# Patient Record
Sex: Female | Born: 1995 | Race: White | Hispanic: No | Marital: Single | Smoking: Never smoker
Health system: Southern US, Community
[De-identification: ages and names within clinical notes are randomized; demographics above are authoritative.]

## PROBLEM LIST (undated history)

## (undated) DIAGNOSIS — G43909 Migraine, unspecified, not intractable, without status migrainosus: Secondary | ICD-10-CM

## (undated) HISTORY — DX: Migraine, unspecified, not intractable, without status migrainosus: G43.909

---

## 2019-02-04 ENCOUNTER — Encounter (HOSPITAL_COMMUNITY): Payer: Self-pay

## 2019-02-04 ENCOUNTER — Other Ambulatory Visit: Payer: Self-pay

## 2019-02-04 DIAGNOSIS — R1084 Generalized abdominal pain: Secondary | ICD-10-CM | POA: Insufficient documentation

## 2019-02-04 DIAGNOSIS — R195 Other fecal abnormalities: Secondary | ICD-10-CM | POA: Diagnosis present

## 2019-02-04 DIAGNOSIS — K625 Hemorrhage of anus and rectum: Secondary | ICD-10-CM | POA: Diagnosis not present

## 2019-02-04 NOTE — ED Triage Notes (Signed)
Pt presents to ED with complaints of abdominal pain, bloody and mucous stools since 1830. Pt c/o generalized abd pain which hurts when she has to go to bathroom. Pt states bleeding was bright red.

## 2019-02-05 ENCOUNTER — Emergency Department (HOSPITAL_COMMUNITY): Payer: BC Managed Care – PPO

## 2019-02-05 ENCOUNTER — Emergency Department (HOSPITAL_COMMUNITY)
Admission: EM | Admit: 2019-02-05 | Discharge: 2019-02-05 | Disposition: A | Discharge status: Home / Self Care | Payer: BC Managed Care – PPO | Attending: Emergency Medicine | Admitting: Emergency Medicine

## 2019-02-05 DIAGNOSIS — K625 Hemorrhage of anus and rectum: Secondary | ICD-10-CM

## 2019-02-05 LAB — TYPE AND SCREEN
ABO/RH(D): B POS
Antibody Screen: NEGATIVE

## 2019-02-05 LAB — CBC
HCT: 42.6 % (ref 36.0–46.0)
Hemoglobin: 13.8 g/dL (ref 12.0–15.0)
MCH: 29.2 pg (ref 26.0–34.0)
MCHC: 32.4 g/dL (ref 30.0–36.0)
MCV: 90.1 fL (ref 80.0–100.0)
Platelets: 231 10*3/uL (ref 150–400)
RBC: 4.73 MIL/uL (ref 3.87–5.11)
RDW: 12.7 % (ref 11.5–15.5)
WBC: 6.1 10*3/uL (ref 4.0–10.5)
nRBC: 0 % (ref 0.0–0.2)

## 2019-02-05 LAB — COMPREHENSIVE METABOLIC PANEL
ALT: 12 U/L (ref 0–44)
AST: 16 U/L (ref 15–41)
Albumin: 3.9 g/dL (ref 3.5–5.0)
Alkaline Phosphatase: 54 U/L (ref 38–126)
Anion gap: 9 (ref 5–15)
BUN: 10 mg/dL (ref 6–20)
CO2: 24 mmol/L (ref 22–32)
Calcium: 8.8 mg/dL — ABNORMAL LOW (ref 8.9–10.3)
Chloride: 104 mmol/L (ref 98–111)
Creatinine, Ser: 0.6 mg/dL (ref 0.44–1.00)
GFR calc Af Amer: >60 mL/min (ref >60)
GFR calc non Af Amer: >60 mL/min (ref >60)
Glucose, Bld: 104 mg/dL — ABNORMAL HIGH (ref 70–99)
Potassium: 4.2 mmol/L (ref 3.5–5.1)
Sodium: 137 mmol/L (ref 135–145)
Total Bilirubin: 0.5 mg/dL (ref 0.3–1.2)
Total Protein: 7.5 g/dL (ref 6.5–8.1)

## 2019-02-05 LAB — URINALYSIS, ROUTINE W REFLEX MICROSCOPIC
Bacteria, UA: NONE SEEN
Bilirubin Urine: NEGATIVE
Glucose, UA: NEGATIVE mg/dL
Ketones, ur: NEGATIVE mg/dL
Leukocytes,Ua: NEGATIVE
Nitrite: NEGATIVE
Protein, ur: NEGATIVE mg/dL
Specific Gravity, Urine: 1.014 (ref 1.005–1.030)
pH: 6 (ref 5.0–8.0)

## 2019-02-05 LAB — POC OCCULT BLOOD, ED: Fecal Occult Bld: NEGATIVE

## 2019-02-05 LAB — POC URINE PREG, ED: Preg Test, Ur: NEGATIVE

## 2019-02-05 MED ORDER — IOHEXOL 300 MG/ML  SOLN
100.0000 mL | Freq: Once | INTRAMUSCULAR | Status: AC | PRN
  Administered 2019-02-05: 100 mL via INTRAVENOUS

## 2019-02-05 NOTE — ED Notes (Signed)
Pt ambulatory to waiting room. Pt verbalized understanding of discharge instructions.   

## 2019-02-05 NOTE — Discharge Instructions (Addendum)
Your testing is reassuring.  There is no evidence of any kind of colon mass or inflammation.  You may have an internal hemorrhoid that was bleeding.  You should follow-up with your primary doctor as well as the GI doctor.  Return to the ED with worsening pain, bleeding, dizziness, lightheadedness or any other concerns.

## 2019-02-05 NOTE — ED Provider Notes (Signed)
[facility] EMERGENCY DEPARTMENT Provider Note   CSN: [id number] Arrival date & time: 02/04/19  2221     History   Chief Complaint Chief Complaint  Patient presents with   Blood In Stools    HPI Mallory Singleton is a 23 y.o. female.     Patient here with abdominal cramping and rectal bleeding.  States that around 6:30 PM she had sudden onset lower abdominal cramping and went to go to the bathroom.  She had a loose bowel movement that was brown but small in caliber.  She had continued cramping and then went again with some more loose brown stools.  The third time she had a some orange and red discoloration in the toilet bowl mixed in with her stool.  She was concerned that it could be blood.  There was no blood with wiping.  The toilet bowl did not turn red.  She denies any fever, chills, nausea or vomiting.  No history of ulcerative colitis or Crohn's disease.  She is had bleeding in the past with straining and anal fissures but denies any straining at this point.  No anal trauma or anal sex.  Her abdominal cramping is since resolved.  No dizziness or lightheadedness.  No chest pain or shortness of breath.  No pain with urination or blood in the urine.  No family history of inflammatory bowel disease.  The history is provided by the patient.    History reviewed. No pertinent past medical history.  There are no active problems to display for this patient.   History reviewed. No pertinent surgical history.   OB History   No obstetric history on file.      Home Medications    Prior to Admission medications   Not on File    Family History No family history on file.  Social History Social History   Tobacco Use   Smoking status: Never Smoker   Smokeless tobacco: Never Used  Substance Use Topics   Alcohol use: Yes   Drug use: Never     Allergies   Patient has no allergy information on record.   Review of Systems Review of Systems  Constitutional: Negative  for activity change, appetite change, fatigue and fever.  Eyes: Negative for visual disturbance.  Respiratory: Negative for cough, chest tightness and shortness of breath.   Gastrointestinal: Positive for abdominal pain and rectal pain.  Genitourinary: Negative for dysuria.  Musculoskeletal: Negative for arthralgias, back pain and myalgias.  Neurological: Negative for dizziness, weakness and headaches.   all other systems are negative except as noted in the HPI and PMH.     Physical Exam Updated Vital Signs BP (!) 145/95 (BP Location: Right Arm)    Pulse (!) 102    Temp 98.5 F (36.9 C) (Oral)    Resp 16    Ht 5\' 6"  (1.676 m)    Wt 64.9 kg    LMP 01/14/2019    SpO2 99%    BMI 23.08 kg/m   Physical Exam Vitals signs and nursing note reviewed.  Constitutional:      General: She is not in acute distress.    Appearance: She is well-developed.  HENT:     Head: Normocephalic and atraumatic.     Mouth/Throat:     Pharynx: No oropharyngeal exudate.  Eyes:     Conjunctiva/sclera: Conjunctivae normal.     Pupils: Pupils are equal, round, and reactive to light.  Neck:     Musculoskeletal: Normal range of  motion and neck supple.     Comments: No meningismus. Cardiovascular:     Rate and Rhythm: Normal rate and regular rhythm.     Heart sounds: Normal heart sounds. No murmur.  Pulmonary:     Effort: Pulmonary effort is normal. No respiratory distress.     Breath sounds: Normal breath sounds.  Abdominal:     Palpations: Abdomen is soft.     Tenderness: There is no abdominal tenderness. There is no guarding or rebound.  Genitourinary:    Comments: Chaperone present.  No external hemorrhoids.  No fissures.  No gross blood on rectal exam.  No palpable masses. Musculoskeletal: Normal range of motion.        General: No tenderness.  Skin:    General: Skin is warm.  Neurological:     Mental Status: She is alert and oriented to person, place, and time.     Cranial Nerves: No cranial nerve  deficit.     Motor: No abnormal muscle tone.     Coordination: Coordination normal.     Comments: No ataxia on finger to nose bilaterally. No pronator drift. 5/5 strength throughout. CN 2-12 intact.Equal grip strength. Sensation intact.   Psychiatric:        Behavior: Behavior normal.      ED Treatments / Results  Labs (all labs ordered are listed, but only abnormal results are displayed) Labs Reviewed  COMPREHENSIVE METABOLIC PANEL - Abnormal; Notable for the following components:      Result Value   Glucose, Bld 104 (*)    Calcium 8.8 (*)    All other components within normal limits  URINALYSIS, ROUTINE W REFLEX MICROSCOPIC - Abnormal; Notable for the following components:   Hgb urine dipstick SMALL (*)    All other components within normal limits  CBC  POC OCCULT BLOOD, ED  POC URINE PREG, ED  POC OCCULT BLOOD, ED  TYPE AND SCREEN    EKG None  Radiology Ct Abdomen Pelvis W Contrast  Result Date: 02/05/2019 CLINICAL DATA:  GI bleeding with generalized abdominal pain. Mucus in stool. EXAM: CT ABDOMEN AND PELVIS WITH CONTRAST TECHNIQUE: Multidetector CT imaging of the abdomen and pelvis was performed using the standard protocol following bolus administration of intravenous contrast. CONTRAST:  14mL OMNIPAQUE IOHEXOL 300 MG/ML  SOLN COMPARISON:  None. FINDINGS: Lower chest:  No contributory findings. Hepatobiliary: No focal liver abnormality.No evidence of biliary obstruction or stone. Pancreas: Unremarkable. Spleen: Unremarkable. Adrenals/Urinary Tract: Negative adrenals. No hydronephrosis or stone. Unremarkable bladder. Stomach/Bowel: No obstruction. No evidence of bowel inflammation including appendicitis. Vascular/Lymphatic: No acute vascular abnormality. No mass or adenopathy. Reproductive:No pathologic findings. Other: Trace pelvic fluid which is low-density. Musculoskeletal: No acute abnormalities. IMPRESSION: 1. No acute finding.  No evidence of colitis. 2. Trace pelvic  fluid which is likely physiologic. Electronically Signed   By: Monte Fantasia M.D.   On: 02/05/2019 05:09    Procedures Procedures (including critical care time)  Medications Ordered in ED Medications - No data to display   Initial Impression / Assessment and Plan / ED Course  I have reviewed the triage vital signs and the nursing notes.  Pertinent labs & imaging results that were available during my care of the patient were reviewed by me and considered in my medical decision making (see chart for details).       Abdominal cramping with rectal bleeding.  Now resolved.  Abdomen is soft without peritoneal signs.  Vitals are stable.  Labs are reassuring with normal  hemoglobin.  Patient declines pain and nausea medications.  Risks and benefits of CT scan discussed with patient who wishes to proceed.  CT scan is negative for acute pathology.  Discussed with patient concern for possible internal hemorrhoid.  No evidence of mass or colitis.  She is stable for outpatient follow-up.  Advised increase fiber, increase fluid, follow-up with PCP and GI.  Return precautions discussed.  BP 132/88 (BP Location: Right Arm)    Pulse 85    Temp 98.5 F (36.9 C) (Oral)    Resp 15    Ht 5\' 6"  (1.676 m)    Wt 64.9 kg    LMP 01/14/2019    SpO2 100%    BMI 23.08 kg/m    Final Clinical Impressions(s) / ED Diagnoses   Final diagnoses:  Rectal bleeding    ED Discharge Orders    None       Glynn Octave, MD 02/05/19 [phone]

## 2019-02-10 ENCOUNTER — Encounter: Payer: Self-pay | Admitting: Gastroenterology

## 2019-03-10 ENCOUNTER — Ambulatory Visit: Payer: BC Managed Care – PPO | Admitting: Gastroenterology

## 2019-05-28 ENCOUNTER — Other Ambulatory Visit: Payer: Self-pay

## 2019-05-28 ENCOUNTER — Ambulatory Visit: Payer: BC Managed Care – PPO | Attending: Obstetrics & Gynecology | Admitting: Physical Therapy

## 2019-05-28 ENCOUNTER — Encounter: Payer: Self-pay | Admitting: Physical Therapy

## 2019-05-28 DIAGNOSIS — R278 Other lack of coordination: Secondary | ICD-10-CM

## 2019-05-28 DIAGNOSIS — R252 Cramp and spasm: Secondary | ICD-10-CM | POA: Diagnosis present

## 2019-05-28 DIAGNOSIS — M6281 Muscle weakness (generalized): Secondary | ICD-10-CM | POA: Insufficient documentation

## 2019-05-28 NOTE — Therapy (Signed)
[facility] [address], [address] [place], [place], [address] Phone: [phone]   Fax:  [phone]  Physical Therapy Evaluation  Patient Details  Name: Mallory Singleton MRN: [id number] Date of Birth: [birth year] Referring Provider (PT): Dr. Geraldine Solar. [place] Charon   Encounter Date: 05/28/2019  PT End of Session - 05/28/19 0953    Visit Number  1    Date for PT Re-Evaluation  08/20/19    Authorization Type  [employer]    PT Start Time  0920    PT Stop Time  1000    PT Time Calculation (min)  40 min    Activity Tolerance  Patient tolerated treatment well;No increased pain    Behavior During Therapy  WFL for tasks assessed/performed       Past Medical History:  Diagnosis Date  . Migraines     History reviewed. No pertinent surgical history.  There were no vitals filed for this visit.   Subjective Assessment - 05/28/19 0921    Subjective  Patient reports 3-4 years ago and started to have tears in the vulva area. My vagina is very red and irritated. As the doctor exams the patient she will clench the pelvic floor muscles. Patient does not have pain with intercourse only pain with tearing. 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    Patient Stated Goals  reduce pain and reduce muscle spasms    Currently in Pain?  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 Geraldine Solar. Wong      Precautions   Precautions  None      Restrictions   Weight Bearing Restrictions  No       Balance Screen   Has the patient fallen in the past 6 months  No    Has the patient had a decrease in activity level because of a fear of falling?   No    Is the patient reluctant to leave their home because of a fear of falling?   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, 10 hours M-F      Cognition   Overall Cognitive Status  Within Functional Limits for tasks assessed      Posture/Postural Control   Posture/Postural Control  No significant limitations      ROM / Strength   AROM / PROM / Strength  AROM;PROM;Strength      AROM   Overall AROM   Unable to assess    Lumbar - Right Side Bend  decreased by 25%    Lumbar -  Right Rotation  decreased by 25%      Strength   Right Hip ABduction  4+/5    Left Hip ABduction  4+/5      Palpation   SI assessment   ASIS are equal with left ASIS shallow; sacrum rotated right                Objective measurements completed on examination: See above findings.    Pelvic Floor Special Questions - 05/28/19 0001    Prior Pregnancies  No    Currently Sexually Active  Yes    Is this Painful  Yes    Marinoff Scale  discomfort that does not affect completion    Urinary Leakage  No    Fecal incontinence  No    External Perineal Exam  Q-tip test with tenderness located from 7 to 5 O'clock and right 7 O'clock    Skin Integrity  Intact;Erthema    Scar  Tear;Restricted    Perineal Body/Introitus   Normal    Prolapse  None    Pelvic Floor Internal Exam  Patient confirms identication and approves PT to assess pelvic floor and treatment    Exam Type  Vaginal    Palpation  tenderness located on the perineal body, bil. levator ani    Strength  weak squeeze, no lift               PT Education - 05/28/19 1004    Education Details  instruction  on perineal massage around the perineal body; instruction on vaginal moisturizers, lubricants, and vaginal care    Person(s) Educated  Patient    Methods  Explanation;Demonstration    Comprehension  Verbalized understanding       PT Short Term Goals - 05/28/19 1019      PT SHORT TERM GOAL #1   Title  independent with intial HEP    Time  4    Status  New    Target Date  06/25/19      PT SHORT TERM GOAL #2   Title  understand how to take care of the vaginal area to promote good vaginal  and vaginal tissue health    Time  4    Period  Weeks    Status  New    Target Date  06/25/19      PT SHORT TERM GOAL #3   Title  able to tolerate the Q-tip to touch the vulva area with no more than 1/10 pain due to improved tissue health    Time  4    Period  Weeks    Status  New    Target Date  06/25/19        PT Long Term Goals - 05/28/19 1122      PT LONG TERM GOAL #1   Title  independent with HEP and understand how to progress herself    Time  12    Period  Weeks    Status  New    Target Date  08/20/19      PT LONG TERM GOAL #2   Title  pain at end of work day in the perineal area decreased >/= 75% due to ability to relax her pelvic floor muscles    Time  12    Period  Weeks    Status  New    Target Date  08/20/19      PT LONG TERM GOAL #3   Title  able to have penetrative intercourse  with pain decreased </= 1/10 and not tearing due to improve tissue mobility of the perineal fascial restrictions and scar    Time  12    Period  Weeks    Status  New    Target Date  08/20/19      PT LONG TERM GOAL #4   Title  pelvic floor strength >/= 3/5 due to improve contractablity of the vaginal sphincter and the ability to fully lengthen the sphincter muscle    Time  12    Period  Weeks    Status  New    Target Date  08/20/19             Plan - 05/28/19 1006    Clinical Impression Statement  Patient is a 23 year old female with vulva pain and tearing at the perineal body  during intercourse for the past 4 years. Patient reports her pain level is 4/10. Patient will have discomfort after walking and standing at work in the vaginal area where she has the tear. Q-tip test with pain on 9-4 O'clock and and left 7 'clock. Palpable tenderness located on the bilateral levator ani, perineal body. Tightnes in the perineal body with scar restrictions from the tearing. Pelvic floor strength is 2/5 with holding 2 seconds. Redness along the vulva area. Bilateral hip abduction is 4+/5. Right lumbar sidebending and rotation decreased by 25%. Pelvis is rotated to the left and sacrum rotated right. Patient will benefit from skilled therapy to reduce tearing of the vulva area with improving the elongation of the tissue, and improve her strength.    Personal Factors and Comorbidities  Sex    Examination-Activity Limitations  Stand;Locomotion Level    Stability/Clinical Decision Making  Stable/Uncomplicated    Clinical Decision Making  Low    Rehab Potential  Excellent    PT Frequency  1x / week    PT Duration  12 weeks    PT Treatment/Interventions  ADLs/Self Care Home Management;Biofeedback;Cryotherapy;Electrical Stimulation;Moist Heat;Ultrasound;Neuromuscular re-education;Therapeutic exercise;Therapeutic activities;Patient/family education;Manual techniques;Dry needling;Scar mobilization    PT Next Visit Plan  scar massage, soft tissue work to the levator ani, pelvic floor stretches, bulging of the pelvic floor, see how MD appt went    Consulted and Agree with Plan of Care  Patient       Patient will benefit from skilled therapeutic intervention in order to improve the following deficits and impairments:  Decreased range of motion, Decreased coordination, Increased fascial restricitons, Increased muscle spasms, Decreased activity tolerance, Pain, Decreased scar mobility, Decreased strength  Visit Diagnosis: Muscle weakness (generalized) - Plan: PT plan of care cert/re-cert  Cramp and  spasm - Plan: PT plan of care cert/re-cert  Other lack of coordination - Plan: PT plan of care cert/re-cert     Problem List There are no problems to display for this patient.   Eulis FosterCheryl Cynethia Schindler, PT 05/28/19 11:27 AM    [facility] [address], [address] [place], [place], [address] Phone: [phone]   Fax:  [phone]  Name: Mallory Singleton MRN: [id number] Date of Birth: 08/28/1995

## 2019-05-28 NOTE — Patient Instructions (Addendum)
Lubrication . Used for intercourse to reduce friction . Avoid ones that have glycerin, warming gels, tingling gels, icing or cooling gel, scented . Avoid parabens due to a preservative similar to female sex hormone . May need to be reapplied once or several times during sexual activity . Can be applied to both partners genitals prior to vaginal penetration to minimize friction or irritation . Prevent irritation and mucosal tears that cause post coital pain and increased the risk of vaginal and urinary tract infections . Oil-based lubricants cannot be used with condoms due to breaking them down.  Least likely to irritate vaginal tissue.  . Plant based-lubes are safe . Silicone-based lubrication are thicker and last long and used for post-menopausal women  Vaginal Lubricators Here is a list of some suggested lubricators you can use for intercourse. Use the most hypoallergenic product.  You can place on you or your partner.   Slippery Stuff  Sylk or Sliquid Natural H2O ( good  if frequent UTI's)  Blossom Organics (www.blossom-organics.com)  Coconut oil, olive oil   PJur Woman Nude- water based lubricant, amazon  Uberlube- Amazon  Aloe Vera- Sprouts has an organic one  Yes lubricant- Ship broker, Target, [facility]  Olive and Bee intimate cream-  www.oliveandbee.com.au  Pink - [employer]  Hybird OASIS Things to avoid in lubricants are glycerin, warming gels, tingling gels, icing or cooling  gels, and scented gels.  Also avoid Vaseline. KY jelly, Replens, and Astroglide kills good bacteria(lactobacilli)  Things to avoid in the vaginal area . Do not use things to irritate the vulvar area . No lotions- see below . No soaps; can use Aveeno or Calendula cleanser if needed. Must be gentle . No deodorants . No douches . Good to sleep without underwear to let the vaginal area to air out . No scrubbing: spread the lips to let warm water rinse over labias  and pat dry  Creams that can be used on the Vulva Area  V [employer]  Vital V Wild Yam Salve  Julva- [employer] Botanical Pro-Meno Wild Yam Cream  Coconut oil, olive oil  Desert Havest Releveum or Desert YUM! Brands . They are used in the vagina to hydrate the mucous membrane that make up the vaginal canal. . Designed to keep a more normal acid balance (ph) . Once placed in the vagina, it will last between two to three days.  . Use 2-3 times per week at bedtime  . Ingredients to avoid is glycerin and fragrance, can increase chance of infection . Should not be used just before sex due to causing irritation . Most are gels administered either in a tampon-shaped applicator or as a vaginal suppository. They are non-hormonal.   Types of Moisturizers  . Vitamin E vaginal suppositories- Whole foods, Amazon . Moist Again . Coconut oil- can break down condoms . Julva- (Do no use if on Tamoxifen) amazon . Yes moisturizer- amazon . NeuEve Silk , NeuEve Silver for menopausal or over 65 (if have severe vaginal atrophy or cancer treatments use NeuEve Silk for  1 month than move to [employer])- [employer], ShapeConsultant.com.cy . Olive and Bee intimate cream- www.oliveandbee.com.au . Mae vaginal moisturizer- Amazon . Aloe   Creams to use externally on the Vulva area  Marathon Oil (good for for cancer patients that had radiation to the area)- [place] or [employer].https://garcia-valdez.org/  V-magic cream - amazon  Julva-amazon  Vital "V Wild Yam salve (  help moisturize and help with thinning vulvar area, does have [place] by Irwin Brakeman labial moisturizer ([place],   Coconut or olive oil  aloe   Things to avoid in the vaginal area . Do not use things to irritate the vulvar area . No lotions just specialized creams for the vulva area- Neogyn, V-magic, No soaps; can use Aveeno or Calendula  cleanser if needed. Must be gentle . No deodorants . No douches . Good to sleep without underwear to let the vaginal area to air out . No scrubbing: spread the lips to let warm water rinse over labias and pat dry [facility] [address] [place], [place] [address] Phone # [phone] Fax [phone]

## 2019-06-18 ENCOUNTER — Encounter: Payer: BC Managed Care – PPO | Admitting: Physical Therapy

## 2019-06-19 ENCOUNTER — Other Ambulatory Visit: Payer: Self-pay

## 2019-06-19 ENCOUNTER — Encounter: Payer: Self-pay | Admitting: Physical Therapy

## 2019-06-19 ENCOUNTER — Ambulatory Visit: Payer: BC Managed Care – PPO | Attending: Obstetrics & Gynecology | Admitting: Physical Therapy

## 2019-06-19 DIAGNOSIS — R252 Cramp and spasm: Secondary | ICD-10-CM | POA: Insufficient documentation

## 2019-06-19 DIAGNOSIS — M6281 Muscle weakness (generalized): Secondary | ICD-10-CM

## 2019-06-19 DIAGNOSIS — R278 Other lack of coordination: Secondary | ICD-10-CM | POA: Insufficient documentation

## 2019-06-19 NOTE — Patient Instructions (Signed)
Access Code: [email or web address]  URL: [email or web address]  Date: 06/19/2019  Prepared by: Eulis Foster   Exercises Supine Butterfly Groin Stretch - 1 reps - 1 sets - 1-2 min hold - 1x daily - 7x weekly Supine Hamstring Stretch - 2 reps - 1 sets - 30 sec hold - 1x daily - 7x weekly Supine Piriformis Stretch - 2 reps - 1 sets - 30 sec hold - 1x daily - 7x weekly V Sit Hip Adductor Hamstring Stretch - 1 reps - 1 sets - 30 sec hold - 1x daily - 7x weekly Child's Pose Stretch - 2 reps - 1 sets - 30 sec hold - 1x daily - 7x weekly [facility] [address] [place], [place] [address] Phone # [phone] Fax [phone]

## 2019-06-19 NOTE — Therapy (Signed)
[facility] [address], [address] [place], [place], [address] Phone: [phone]   Fax:  [phone]  Physical Therapy Treatment  Patient Details  Name: Mallory Singleton MRN: [id number] Date of Birth: 1995-10-26 Referring Provider (PT): Dr. Geraldine Solar. [place] Charon   Encounter Date: 06/19/2019  PT End of Session - 06/19/19 0803    Visit Number  2    Date for PT Re-Evaluation  08/20/19    Authorization Type  BCBS    PT Start Time  0800    PT Stop Time  0840    PT Time Calculation (min)  40 min    Activity Tolerance  Patient tolerated treatment well;No increased pain    Behavior During Therapy  WFL for tasks assessed/performed       Past Medical History:  Diagnosis Date  . Migraines     History reviewed. No pertinent surgical history.  There were no vitals filed for this visit.  Subjective Assessment - 06/19/19 0805    Subjective  I am going to see a dermatologist 07/17/2019. I have not had a flare-up. I have had relief with the cream. I had sex last week and I tore. I am waiting for a hormone cream.    Patient Stated Goals  reduce pain and reduce muscle spasms    Currently in Pain?  Yes    Pain Score  5     Pain Location  Vagina    Pain Orientation  Lower    Pain Descriptors / Indicators  Sharp    Pain Type  Acute pain    Pain Onset  More than a month ago    Pain Frequency  Intermittent    Aggravating Factors   during intercourse    Pain Relieving Factors  no intercourse for 2-3 weeks for tear to heal    Multiple Pain Sites  No                    Pelvic Floor Special Questions - 06/19/19 0001    Pelvic Floor Internal Exam  Patient confirms identication and approves PT to assess pelvic floor and treatment    Exam Type  Vaginal        [facility] Adult PT Treatment/Exercise - 06/19/19 0001      Self-Care   Self-Care  Other Self-Care Comments    Other Self-Care Comments   education on using [employer]  on the area of tear to help with healing      Lumbar Exercises: Stretches   Active Hamstring Stretch  Right;Left;1 rep;30 seconds    Active Hamstring Stretch Limitations  supine    Quadruped Mid Back Stretch  1 rep;30 seconds    Piriformis Stretch  Right;Left;1 rep;30 seconds    Piriformis Stretch Limitations  supine    Other Lumbar Stretch Exercise  butterfly stretch with diaphragmatic breathing    Other Lumbar Stretch Exercise  hip adductor stretch      Manual Therapy   Manual Therapy  Internal Pelvic Floor;Soft tissue mobilization    Manual therapy comments  educated patient on how to perform self perineal massage using a mirror and return demonstration    Soft tissue mobilization  to bilateral levator ani externally with elongation of the hip adductors    Internal Pelvic Floor  bilateral levator ani and by the pubic rami             PT Education - 06/19/19 0840    Education Details  Access Code: [email or web address]; education on self perineal massage and using [employer] to help tear heal    Person(s) Educated  Patient    Methods  Explanation;Demonstration;Verbal cues;Handout    Comprehension  Verbalized understanding;Returned demonstration       PT Short Term Goals - 06/19/19 0848      PT SHORT TERM GOAL #1   Title  independent with intial HEP    Time  4    Period  Weeks    Status  Achieved        PT Long Term Goals - 05/28/19 1122      PT LONG TERM GOAL #1   Title  independent with HEP and understand how to progress herself    Time  12    Period  Weeks    Status  New    Target Date  08/20/19      PT LONG TERM GOAL #2   Title  pain at end of work day in the perineal area decreased >/= 75% due to ability to relax her pelvic floor muscles    Time  12    Period  Weeks    Status  New    Target Date  08/20/19      PT LONG TERM GOAL #3   Title  able to have penetrative intercourse with pain decreased </= 1/10 and not tearing due to improve tissue mobility of  the perineal fascial restrictions and scar    Time  12    Period  Weeks    Status  New    Target Date  08/20/19      PT LONG TERM GOAL #4   Title  pelvic floor strength >/= 3/5 due to improve contractablity of the vaginal sphincter and the ability to fully lengthen the sphincter muscle    Time  12    Period  Weeks    Status  New    Target Date  08/20/19            Plan - 06/19/19 0803    Clinical Impression Statement  Patient is using V-magic and it is helping the area to feel more pliable. Patient had intercourse and tore at the posterior forchette. Patient is performing scar massage to the perineal body. Patient will be seeing a dermatologist to assist in the care of the vulvar area. Patient is to pick up a hormone compond to use on the vulva area. Patient had tenderness located on bilateral vulvar area. Patient will benefit from skilled therapy to reduce tearing of the vulva area withimproving the elongation of the tissue and improve her strength.    Personal Factors and Comorbidities  Sex    Examination-Activity Limitations  Stand;Locomotion Level    Stability/Clinical Decision Making  Stable/Uncomplicated    Rehab Potential  Excellent    PT Frequency  1x / week    PT Duration  12 weeks    PT Treatment/Interventions  ADLs/Self Care Home Management;Biofeedback;Cryotherapy;Electrical Stimulation;Moist Heat;Ultrasound;Neuromuscular re-education;Therapeutic exercise;Therapeutic activities;Patient/family education;Manual techniques;Dry needling;Scar mobilization    PT Next Visit Plan  scar massage, soft tissue work to the levator ani,  bulging of the pelvic floor    PT Home Exercise Plan  Access Code: [email or web address]    Consulted and Agree with Plan of Care  Patient       Patient will benefit from skilled therapeutic intervention in order to improve the following deficits and impairments:  Decreased range of motion, Decreased coordination, Increased fascial restricitons, Increased muscle  spasms, Decreased activity tolerance, Pain, Decreased scar mobility, Decreased strength  Visit Diagnosis: Muscle weakness (generalized)  Cramp and spasm  Other lack of coordination     Problem List There are no problems to display for this patient.   Eulis Foster, PT 06/19/19 8:49 AM   [facility] [address], [address] [place], [place], [address] Phone: [phone]   Fax:  [phone]  Name: KERIE BADGER MRN: [id number] Date of Birth: [birth year]

## 2019-06-26 ENCOUNTER — Ambulatory Visit: Payer: BC Managed Care – PPO | Admitting: Physical Therapy

## 2019-06-26 ENCOUNTER — Other Ambulatory Visit: Payer: Self-pay

## 2019-06-26 ENCOUNTER — Encounter: Payer: Self-pay | Admitting: Physical Therapy

## 2019-06-26 DIAGNOSIS — R278 Other lack of coordination: Secondary | ICD-10-CM

## 2019-06-26 DIAGNOSIS — M6281 Muscle weakness (generalized): Secondary | ICD-10-CM

## 2019-06-26 DIAGNOSIS — R252 Cramp and spasm: Secondary | ICD-10-CM

## 2019-06-26 NOTE — Therapy (Signed)
[facility] [address] [place], [place], [address] Phone: [phone]   Fax:  [phone]  Physical Therapy Treatment  Patient Details  Name: Mallory Singleton MRN: [id number] Date of Birth: 10-19-1995 Referring Provider (PT): Dr. Lovena Le. Jacelyn Grip   Encounter Date: 06/26/2019  PT End of Session - 06/26/19 0843    Visit Number  3    Date for PT Re-Evaluation  08/20/19    Authorization Type  BCBS    PT Start Time  0800    PT Stop Time  0843    PT Time Calculation (min)  43 min    Activity Tolerance  Patient tolerated treatment well;No increased pain    Behavior During Therapy  WFL for tasks assessed/performed       Past Medical History:  Diagnosis Date  . Migraines     History reviewed. No pertinent surgical history.  There were no vitals filed for this visit.  Subjective Assessment - 06/26/19 0803    Subjective  The left side is more tense and I am feeling the muscles relax. I got the hormone cream and I still have to use the other creams. I have had a different discharge and monitoring it. The scar still looks red and irritated.    Patient Stated Goals  reduce pain and reduce muscle spasms    Currently in Pain?  Yes    Pain Score  5     Pain Location  Vagina    Pain Orientation  Lower    Pain Descriptors / Indicators  Sharp    Pain Type  Acute pain    Pain Onset  More than a month ago    Pain Frequency  Intermittent    Aggravating Factors   during intercourse    Pain Relieving Factors  no intercourse for 2-3 weeks for tear to heal    Multiple Pain Sites  No                    Pelvic Floor Special Questions - 06/26/19 0001    Pelvic Floor Internal Exam  Patient confirms identication and approves PT to assess pelvic floor and treatment    Exam Type  Vaginal    Palpation  able to bulge the pelvic floor    Strength  weak squeeze, no lift        [facility] Adult PT Treatment/Exercise -  06/26/19 0001      Self-Care   Self-Care  Other Self-Care Comments    Other Self-Care Comments   education on vulva and labia massage to promote blood flow       Neuro Re-ed    Neuro Re-ed Details   bulging of the pelvic floor in reclined sitting to elongate the tissue      Lumbar Exercises: Stretches   Active Hamstring Stretch  Right;Left;1 rep;30 seconds    Active Hamstring Stretch Limitations  supine, using the strap    Piriformis Stretch  Right;Left;1 rep;30 seconds    Piriformis Stretch Limitations  supine    Other Lumbar Stretch Exercise  butterfly stretch in sitting    Other Lumbar Stretch Exercise  hip adductor stretch      Manual Therapy   Manual Therapy  Internal Pelvic Floor    Soft tissue mobilization  release of the levator ani with one finger in the vaginal canal and the other outside             PT Education -  06/26/19 0842    Education Details  bulging of the pelvic floor, vulvar and labia massage to improve blood flow and elongation of tissue; gave patient a handout on vaginal renewal    Person(s) Educated  Patient    Methods  Explanation;Demonstration    Comprehension  Verbalized understanding;Returned demonstration       PT Short Term Goals - 06/26/19 0847      PT SHORT TERM GOAL #1   Title  independent with intial HEP    Time  4    Period  Weeks    Status  Achieved    Target Date  06/25/19      PT SHORT TERM GOAL #2   Title  understand how to take care of the vaginal area to promote good vaginal  and vaginal tissue health    Time  4    Period  Weeks    Status  Achieved      PT SHORT TERM GOAL #3   Title  able to tolerate the Q-tip to touch the vulva area with no more than 1/10 pain due to improved tissue health    Time  4    Period  Weeks    Status  On-going        PT Long Term Goals - 05/28/19 1122      PT LONG TERM GOAL #1   Title  independent with HEP and understand how to progress herself    Time  12    Period  Weeks    Status   New    Target Date  08/20/19      PT LONG TERM GOAL #2   Title  pain at end of work day in the perineal area decreased >/= 75% due to ability to relax her pelvic floor muscles    Time  12    Period  Weeks    Status  New    Target Date  08/20/19      PT LONG TERM GOAL #3   Title  able to have penetrative intercourse with pain decreased </= 1/10 and not tearing due to improve tissue mobility of the perineal fascial restrictions and scar    Time  12    Period  Weeks    Status  New    Target Date  08/20/19      PT LONG TERM GOAL #4   Title  pelvic floor strength >/= 3/5 due to improve contractablity of the vaginal sphincter and the ability to fully lengthen the sphincter muscle    Time  12    Period  Weeks    Status  New    Target Date  08/20/19            Plan - 06/26/19 0844    Clinical Impression Statement  Patient was able to tolerate internal work with less pain. Patient levator ani muscle was able to relax with greater ease. Patient tissue is lighter pink now. Patient is able to bulge the pelvic floor correctly. Patient has not had intercourse therefore the tear can continue to heal. Patient understands how to massage the vulva and labia to improve blood flow and health of tissue. Patient will benefit from skilled therapy to reduce tearing of the vulva area with improving the elongation of the tissue and improve her strenght.    Personal Factors and Comorbidities  Sex    Examination-Activity Limitations  Stand;Locomotion Level    Stability/Clinical Decision Making  Stable/Uncomplicated  Rehab Potential  Excellent    PT Frequency  1x / week    PT Duration  12 weeks    PT Treatment/Interventions  ADLs/Self Care Home Management;Biofeedback;Cryotherapy;Electrical Stimulation;Moist Heat;Ultrasound;Neuromuscular re-education;Therapeutic exercise;Therapeutic activities;Patient/family education;Manual techniques;Dry needling;Scar mobilization    PT Next Visit Plan  scar massage,  soft tissue work to the levator ani especiall on the right,Q tip test    PT Home Exercise Plan  Access Code: [email or web address]    Consulted and Agree with Plan of Care  Patient       Patient will benefit from skilled therapeutic intervention in order to improve the following deficits and impairments:  Decreased range of motion, Decreased coordination, Increased fascial restricitons, Increased muscle spasms, Decreased activity tolerance, Pain, Decreased scar mobility, Decreased strength  Visit Diagnosis: Muscle weakness (generalized)  Cramp and spasm  Other lack of coordination     Problem List There are no problems to display for this patient.   Eulis Foster, PT 06/26/19 8:57 AM   [facility] [address] [place], [place], [address] Phone: [phone]   Fax:  [phone]  Name: DOYCE STONEHOUSE MRN: [id number] Date of Birth: 1995-09-18

## 2019-07-10 ENCOUNTER — Encounter: Payer: BC Managed Care – PPO | Admitting: Physical Therapy

## 2019-07-17 ENCOUNTER — Ambulatory Visit: Payer: BC Managed Care – PPO | Attending: Obstetrics & Gynecology | Admitting: Physical Therapy

## 2019-07-17 ENCOUNTER — Other Ambulatory Visit: Payer: Self-pay

## 2019-07-17 ENCOUNTER — Encounter: Payer: BC Managed Care – PPO | Admitting: Physical Therapy

## 2019-07-17 ENCOUNTER — Encounter: Payer: Self-pay | Admitting: Physical Therapy

## 2019-07-17 DIAGNOSIS — R252 Cramp and spasm: Secondary | ICD-10-CM | POA: Insufficient documentation

## 2019-07-17 DIAGNOSIS — R278 Other lack of coordination: Secondary | ICD-10-CM | POA: Insufficient documentation

## 2019-07-17 DIAGNOSIS — M6281 Muscle weakness (generalized): Secondary | ICD-10-CM

## 2019-07-17 NOTE — Patient Instructions (Addendum)
[email or web address]  [email or web address]   [facility] Vaginal Dilators (Individual) [facility] [address] [place], [place] [address] Phone # [phone] Fax [phone]

## 2019-07-17 NOTE — Therapy (Signed)
[facility] [address] [place], [place], [address] Phone: [phone]   Fax:  [phone]  Physical Therapy Treatment  Patient Details  Name: Mallory Singleton MRN: [id number] Date of Birth: 02-23-1996 Referring Provider (PT): Dr. Lovena Le. Jacelyn Grip   Encounter Date: 07/17/2019  PT End of Session - 07/17/19 1530    Visit Number  4    Date for PT Re-Evaluation  08/20/19    Authorization Type  BCBS    PT Start Time  7169    PT Stop Time  1530    PT Time Calculation (min)  45 min    Activity Tolerance  Patient tolerated treatment well;No increased pain    Behavior During Therapy  WFL for tasks assessed/performed       Past Medical History:  Diagnosis Date  . Migraines     History reviewed. No pertinent surgical history.  There were no vitals filed for this visit.  Subjective Assessment - 07/17/19 1448    Subjective  I went to the dermatologist today and feel PT will help best. When I massage I can feel the tension but afterwards I feel better. I started the estrogen cream. I will use the v-magic as needed.    Patient Stated Goals  reduce pain and reduce muscle spasms    Currently in Pain?  Yes    Pain Score  5     Pain Location  Vagina    Pain Orientation  Lower    Pain Descriptors / Indicators  Sharp    Pain Type  Acute pain    Pain Onset  More than a month ago    Pain Frequency  Intermittent    Aggravating Factors   during intercourse    Pain Relieving Factors  no intercourse for 2-3 weeks for tear to heal    Multiple Pain Sites  No                    Pelvic Floor Special Questions - 07/17/19 0001    Pelvic Floor Internal Exam  Patient confirms identication and approves PT to assess pelvic floor and treatment    Exam Type  Vaginal    Palpation  release of the left levator ani and right was tighter    Strength  weak squeeze, no lift        [facility] Adult PT Treatment/Exercise - 07/17/19  0001      Self-Care   Self-Care  Other Self-Care Comments    Other Self-Care Comments   education on different dilators to use to expand the vaginal canal and improve tissue elasticity for penile penetration      Therapeutic Activites    Therapeutic Activities  Other Therapeutic Activities    Other Therapeutic Activities  education on different positions with intercourse to reduce the tearing of the perineal body      Manual Therapy   Manual Therapy  Internal Pelvic Floor    Internal Pelvic Floor  bilateral levator ani with on hand internally and other externally releasing through all of the fascia             PT Education - 07/17/19 1510    Education Details  instruction on different dilators and information on different sexual positions to reduce tearing of the perineum    Person(s) Educated  Patient    Methods  Explanation;Demonstration;Handout    Comprehension  Returned demonstration;Verbalized understanding       PT Short Term  Goals - 06/26/19 0847      PT SHORT TERM GOAL #1   Title  independent with intial HEP    Time  4    Period  Weeks    Status  Achieved    Target Date  06/25/19      PT SHORT TERM GOAL #2   Title  understand how to take care of the vaginal area to promote good vaginal  and vaginal tissue health    Time  4    Period  Weeks    Status  Achieved      PT SHORT TERM GOAL #3   Title  able to tolerate the Q-tip to touch the vulva area with no more than 1/10 pain due to improved tissue health    Time  4    Period  Weeks    Status  On-going        PT Long Term Goals - 07/17/19 1533      PT LONG TERM GOAL #1   Title  independent with HEP and understand how to progress herself    Time  12    Period  Weeks    Status  On-going      PT LONG TERM GOAL #2   Title  pain at end of work day in the perineal area decreased >/= 75% due to ability to relax her pelvic floor muscles    Time  12    Period  Weeks    Status  On-going      PT LONG TERM  GOAL #3   Title  able to have penetrative intercourse with pain decreased </= 1/10 and not tearing due to improve tissue mobility of the perineal fascial restrictions and scar    Time  12    Period  Weeks    Status  On-going      PT LONG TERM GOAL #4   Title  pelvic floor strength >/= 3/5 due to improve contractablity of the vaginal sphincter and the ability to fully lengthen the sphincter muscle    Time  12    Period  Weeks    Status  On-going            Plan - 07/17/19 1530    Clinical Impression Statement  Patient had another tear from intercourse. She was educated on dilators to expand the canal and different sexual positions to reduce tearing of the perineal body. Patient has tightness in the levator ani area. Patient will benefit from skilled therapy to reduce tearing of the vulva area with improving the elongation of the tissue and improve her strength.    Personal Factors and Comorbidities  Sex    Examination-Activity Limitations  Stand;Locomotion Level    Stability/Clinical Decision Making  Stable/Uncomplicated    Rehab Potential  Excellent    PT Frequency  1x / week    PT Duration  12 weeks    PT Treatment/Interventions  ADLs/Self Care Home Management;Biofeedback;Cryotherapy;Electrical Stimulation;Moist Heat;Ultrasound;Neuromuscular re-education;Therapeutic exercise;Therapeutic activities;Patient/family education;Manual techniques;Dry needling;Scar mobilization    PT Next Visit Plan  dry needling the levator ani, bulbocavernosus and ischiocavernosus, perineal body; q-tip test    PT Home Exercise Plan  Access Code: [email or web address]    Consulted and Agree with Plan of Care  Patient       Patient will benefit from skilled therapeutic intervention in order to improve the following deficits and impairments:  Decreased range of motion, Decreased coordination, Increased fascial restricitons, Increased muscle spasms, Decreased activity tolerance,  Pain, Decreased scar mobility, Decreased  strength  Visit Diagnosis: Muscle weakness (generalized)  Cramp and spasm  Other lack of coordination     Problem List There are no problems to display for this patient.   Eulis Foster, PT 07/17/19 3:34 PM   [facility] [address] [place], [place], [address] Phone: [phone]   Fax:  [phone]  Name: Mallory Singleton MRN: [id number] Date of Birth: [birth year]

## 2019-07-24 ENCOUNTER — Encounter: Payer: Self-pay | Admitting: Physical Therapy

## 2019-07-24 ENCOUNTER — Ambulatory Visit: Payer: BC Managed Care – PPO | Admitting: Physical Therapy

## 2019-07-24 ENCOUNTER — Other Ambulatory Visit: Payer: Self-pay

## 2019-07-24 DIAGNOSIS — M6281 Muscle weakness (generalized): Secondary | ICD-10-CM | POA: Diagnosis not present

## 2019-07-24 DIAGNOSIS — R278 Other lack of coordination: Secondary | ICD-10-CM

## 2019-07-24 DIAGNOSIS — R252 Cramp and spasm: Secondary | ICD-10-CM

## 2019-07-24 NOTE — Patient Instructions (Addendum)

## 2019-07-24 NOTE — Therapy (Signed)
[facility] [address] [place], [place], [address] Phone: [phone]   Fax:  [phone]  Physical Therapy Treatment  Patient Details  Name: Mallory Singleton MRN: [id number] Date of Birth: Jan 04, 1996 Referring Provider (PT): Dr. Geraldine Solar. [place] Charon   Encounter Date: 07/24/2019  PT End of Session - 07/24/19 0842    Visit Number  5    Date for PT Re-Evaluation  08/20/19    Authorization Type  BC[employer]    PT Start Time  0800    PT Stop Time  0841    PT Time Calculation (min)  41 min    Activity Tolerance  Patient tolerated treatment well;No increased pain    Behavior During Therapy  WFL for tasks assessed/performed       Past Medical History:  Diagnosis Date  . Migraines     History reviewed. No pertinent surgical history.  There were no vitals filed for this visit.  Subjective Assessment - 07/24/19 0805    Subjective  Everything is fine. I had intercourse and the tear is open again. I did not feel the tear until afterwards. No pain with intercourse. I did not get the dilators yet.    Patient Stated Goals  reduce pain and reduce muscle spasms    Currently in Pain?  No/denies                    Pelvic Floor Special Questions - 07/24/19 0001    Pelvic Floor Internal Exam  Patient confirms identication and approves PT to assess pelvic floor and treatment    Exam Type  Vaginal        [facility] Adult PT Treatment/Exercise - 07/24/19 0001      Lumbar Exercises: Aerobic   Elliptical  level 1 for 5 minutes while assessing patient      Manual Therapy   Manual Therapy  Internal Pelvic Floor    Internal Pelvic Floor  release of the perineal body, ischiocavernosus, bulbocavernosus, and levator ani to elongate after dry needling       Trigger Point Dry Needling - 07/24/19 0001    Consent Given?  Yes    Education Handout Provided  Yes    Other Dry Needling  perineal body, ishiocavernosus   trigger point  release, elongation of muscle          PT Education - 07/24/19 0814    Education Details  information on dry needling    Person(s) Educated  Patient    Methods  Explanation;Handout    Comprehension  Verbalized understanding       PT Short Term Goals - 06/26/19 0847      PT SHORT TERM GOAL #1   Title  independent with intial HEP    Time  4    Period  Weeks    Status  Achieved    Target Date  06/25/19      PT SHORT TERM GOAL #2   Title  understand how to take care of the vaginal area to promote good vaginal  and vaginal tissue health    Time  4    Period  Weeks    Status  Achieved      PT SHORT TERM GOAL #3   Title  able to tolerate the Q-tip to touch the vulva area with no more than 1/10 pain due to improved tissue health    Time  4    Period  Weeks  Status  On-going        PT Long Term Goals - 07/17/19 1533      PT LONG TERM GOAL #1   Title  independent with HEP and understand how to progress herself    Time  12    Period  Weeks    Status  On-going      PT LONG TERM GOAL #2   Title  pain at end of work day in the perineal area decreased >/= 75% due to ability to relax her pelvic floor muscles    Time  12    Period  Weeks    Status  On-going      PT LONG TERM GOAL #3   Title  able to have penetrative intercourse with pain decreased </= 1/10 and not tearing due to improve tissue mobility of the perineal fascial restrictions and scar    Time  12    Period  Weeks    Status  On-going      PT LONG TERM GOAL #4   Title  pelvic floor strength >/= 3/5 due to improve contractablity of the vaginal sphincter and the ability to fully lengthen the sphincter muscle    Time  12    Period  Weeks    Status  On-going            Plan - 07/24/19 0807    Clinical Impression Statement  Patient has had intercourse and tore at the perineal body. Patient does not feel pain during intercourse but will afterwards when urine hits the tear. Patient has not gotten the  dilators yet. Patient responded well with the dry needling and the muscles relaxed and became softer. Patient has more fascial release on the left pelvic floor compared to the right. Patient will benefit from skilled therapy to reduce tearing of the vulva area with improving the elongation of the tissue and improve her strength.    Personal Factors and Comorbidities  Sex    Examination-Activity Limitations  Stand;Locomotion Level    Stability/Clinical Decision Making  Stable/Uncomplicated    Rehab Potential  Excellent    PT Frequency  1x / week    PT Duration  12 weeks    PT Treatment/Interventions  ADLs/Self Care Home Management;Biofeedback;Cryotherapy;Electrical Stimulation;Moist Heat;Ultrasound;Neuromuscular re-education;Therapeutic exercise;Therapeutic activities;Patient/family education;Manual techniques;Dry needling;Scar mobilization    PT Next Visit Plan  dry needling the levator ani, bulbocavernosus and ischiocavernosus, perineal body; q-tip test    PT Home Exercise Plan  Access Code: [email or web address]    Consulted and Agree with Plan of Care  Patient       Patient will benefit from skilled therapeutic intervention in order to improve the following deficits and impairments:  Decreased range of motion, Decreased coordination, Increased fascial restricitons, Increased muscle spasms, Decreased activity tolerance, Pain, Decreased scar mobility, Decreased strength  Visit Diagnosis: Muscle weakness (generalized)  Cramp and spasm  Other lack of coordination     Problem List There are no problems to display for this patient.   Earlie Counts, PT 07/24/19 8:48 AM   [facility] [address], [place], [place], [address] Phone: [phone]   Fax:  [phone]  Name: Mallory Singleton MRN: [id number] Date of Birth: 02/26/1996

## 2019-08-07 ENCOUNTER — Ambulatory Visit: Payer: BC Managed Care – PPO | Admitting: Physical Therapy

## 2019-08-21 ENCOUNTER — Other Ambulatory Visit: Payer: Self-pay

## 2019-08-21 ENCOUNTER — Encounter: Payer: Self-pay | Admitting: Physical Therapy

## 2019-08-21 ENCOUNTER — Ambulatory Visit: Payer: BC Managed Care – PPO | Attending: Obstetrics & Gynecology | Admitting: Physical Therapy

## 2019-08-21 DIAGNOSIS — R252 Cramp and spasm: Secondary | ICD-10-CM | POA: Insufficient documentation

## 2019-08-21 DIAGNOSIS — R278 Other lack of coordination: Secondary | ICD-10-CM | POA: Insufficient documentation

## 2019-08-21 DIAGNOSIS — M6281 Muscle weakness (generalized): Secondary | ICD-10-CM | POA: Diagnosis present

## 2019-08-21 NOTE — Therapy (Addendum)
[facility] [address], [place], [place], [address] Phone: [phone]   Fax:  [phone]  Physical Therapy Treatment  Patient Details  Name: Mallory Singleton MRN: [id number] Date of Birth: April 11, 1996 Referring Provider (PT): Dr. Lovena Le. Jacelyn Grip   Encounter Date: 08/21/2019  PT End of Session - 08/21/19 0842    Visit Number  6    Date for PT Re-Evaluation  02/21/20    Authorization Type  BCBS    PT Start Time  0800    PT Stop Time  0842    PT Time Calculation (min)  42 min    Activity Tolerance  Patient tolerated treatment well;No increased pain    Behavior During Therapy  WFL for tasks assessed/performed       Past Medical History:  Diagnosis Date  . Migraines     History reviewed. No pertinent surgical history.  There were no vitals filed for this visit.  Subjective Assessment - 08/21/19 0802    Subjective  The tear is getting better and does  not get as big. I go to [facility] for another check up. The massage helps. I do not have the irritation like I used too.    Patient Stated Goals  reduce pain and reduce muscle spasms    Currently in Pain?  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 Lovena Le. 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      Cognition   Overall Cognitive Status  Within Functional Limits for tasks assessed      Posture/Postural Control   Posture/Postural Control  No significant limitations      AROM   Overall AROM   Unable to assess    Lumbar - Right Side  Bend  full    Lumbar - Right Rotation  full      Strength   Right Hip ABduction  4+/5    Left Hip ABduction  4+/5      Palpation   SI assessment   ASIS are equal and sacrum in correct alignment                Pelvic Floor Special Questions - 08/21/19 0001    Urinary Leakage  No    Fecal incontinence  No    Pelvic Floor Internal Exam  Patient confirms identication and approves PT to assess pelvic floor and treatment    Exam Type  Vaginal    Palpation  tightness in the posterior compartment of the pelvic floor and perineal body    Strength  fair squeeze, definite lift    Strength # of seconds  2        [facility] Adult PT Treatment/Exercise - 08/21/19 0001      Therapeutic Activites    Therapeutic Activities  Work Simulation    Other Therapeutic Activities  standing at work with reducing the posterior pelvic tilt, squat on  break to relax the pelvic floor, pelvic movements while standing, one foot on step while standing, taking time in the commode instead of rushing to relax the  pelvic floor      Lumbar Exercises: Stretches   Hip Flexor Stretch  Right;Left;1 rep;60 seconds    Hip Flexor Stretch Limitations  foam roll    ITB Stretch  Right;Left;1 rep;60 seconds    ITB Stretch Limitations  foam roll    Piriformis Stretch  Right;Left;1 rep;60 seconds    Piriformis Stretch Limitations  foam roll      Manual Therapy   Manual Therapy  Internal Pelvic Floor    Internal Pelvic Floor  release of the perineal body, sides of the coccyx, superior transverse perineum, levator ani             PT Education - 08/21/19 0845    Education Details  ways to stand at work to reduce tension in pelvic floor    Person(s) Educated  Patient    Methods  Explanation;Demonstration    Comprehension  Verbalized understanding;Returned demonstration       PT Short Term Goals - 06/26/19 0847      PT SHORT TERM GOAL #1   Title  independent with intial HEP    Time  4    Period  Weeks     Status  Achieved    Target Date  06/25/19      PT SHORT TERM GOAL #2   Title  understand how to take care of the vaginal area to promote good vaginal  and vaginal tissue health    Time  4    Period  Weeks    Status  Achieved      PT SHORT TERM GOAL #3   Title  able to tolerate the Q-tip to touch the vulva area with no more than 1/10 pain due to improved tissue health    Time  4    Period  Weeks    Status  On-going        PT Long Term Goals - 08/21/19 0846      PT LONG TERM GOAL #1   Title  independent with HEP and understand how to progress herself    Time  12    Period  Weeks    Status  On-going      PT LONG TERM GOAL #2   Title  pain at end of work day in the perineal area decreased >/= 75% due to ability to relax her pelvic floor muscles    Time  12    Period  Weeks    Status  On-going      PT LONG TERM GOAL #3   Title  able to have penetrative intercourse with pain decreased </= 1/10 and not tearing due to improve tissue mobility of the perineal fascial restrictions and scar    Baseline  tears are getting smaller    Time  12    Period  Weeks    Status  On-going      PT LONG TERM GOAL #4   Title  pelvic floor strength >/= 3/5 due to improve contractablity of the vaginal sphincter and the ability to fully lengthen the sphincter muscle    Baseline  3/5 hold for 3 seconds    Time  12    Period  Weeks    Status  On-going            Plan - 08/21/19 0388  Clinical Impression Statement  Patient is having less tearing and the tears are not as deep. Patient perineal area is not red and irritated. Patient is able to have the therapist palpate the vulvar area without pain. Patient has tightness in the posterior compartment of the pelvic floor. Pelvic floor strength is 3/5 holding for 3 seconds. Patient was educated on how to stand and work without a posterior pelvic tilt and decrease clenching of the gluteals. Patient continues to have weakness in the gluteals. Patient  will benefit from skilled therapy to reduce tearing of the perineum, relax the post. compartment of the pelvic floor and elongation of the tissue and improve her strength.    Personal Factors and Comorbidities  Sex    Examination-Activity Limitations  Stand;Locomotion Level    Stability/Clinical Decision Making  Stable/Uncomplicated    Rehab Potential  Excellent    PT Frequency  1x / week   every 3 weeks   PT Duration  Other (comment)   6 months   PT Treatment/Interventions  ADLs/Self Care Home Management;Biofeedback;Cryotherapy;Electrical Stimulation;Moist Heat;Ultrasound;Neuromuscular re-education;Therapeutic exercise;Therapeutic activities;Patient/family education;Manual techniques;Dry needling;Scar mobilization    PT Next Visit Plan  dry needling the levator ani, bulbocavernosus and ischiocavernosus, perineal body;work on post cmpartment of the levator ani, coccy release    PT Home Exercise Plan  Access Code: [email or web address]    Recommended Other Services  MD renewal    Consulted and Agree with Plan of Care  Patient       Patient will benefit from skilled therapeutic intervention in order to improve the following deficits and impairments:  Decreased range of motion, Decreased coordination, Increased fascial restricitons, Increased muscle spasms, Decreased activity tolerance, Pain, Decreased scar mobility, Decreased strength  Visit Diagnosis: Muscle weakness (generalized) - Plan: PT plan of care cert/re-cert  Cramp and spasm - Plan: PT plan of care cert/re-cert  Other lack of coordination - Plan: PT plan of care cert/re-cert     Problem List There are no problems to display for this patient.   Earlie Counts, PT 08/21/19 9:01 AM   [facility] [address], [place], [place], [address] Phone: [phone]   Fax:  [phone]  Name: Mallory Singleton MRN: [id number] Date of Birth: 1995/09/14 PHYSICAL THERAPY DISCHARGE  SUMMARY  Visits from Start of Care: 6  Current functional level related to goals / functional outcomes: Patient called to cancel her appointment. Patient did not leave a reason.    Remaining deficits: See above.    Education / Equipment: HEP Plan: Patient agrees to discharge.  Patient goals were partially met. Patient is being discharged due to the patient's request. Thank you for the referral. Earlie Counts, PT 09/25/19 11:30 AM   ?????

## 2019-09-25 ENCOUNTER — Encounter: Payer: Self-pay | Admitting: Physical Therapy

## 2020-01-11 HISTORY — PX: VAGINA RECONSTRUCTION SURGERY: SHX828

## 2020-04-29 IMAGING — CT CT ABDOMEN AND PELVIS WITH CONTRAST
2 of 4 series · 16 of 46 positions shown, 18 images · IV contrast (Isovue)
Comparison: None.

CLINICAL DATA: GI bleeding with generalized abdominal pain. Mucus
in stool.

EXAM:
CT ABDOMEN AND PELVIS WITH CONTRAST
TECHNIQUE: Multidetector CT imaging of the abdomen and pelvis was performed
using the standard protocol following bolus administration of
intravenous contrast.
CONTRAST:  100mL OMNIPAQUE IOHEXOL 300 MG/ML  SOLN

[Series 2: axial st · axial · 0.56mm/px · z∈[+622,+1002]mm · 13 of 88 slices shown, 15 images]
[im 6/88  soft-tissue]
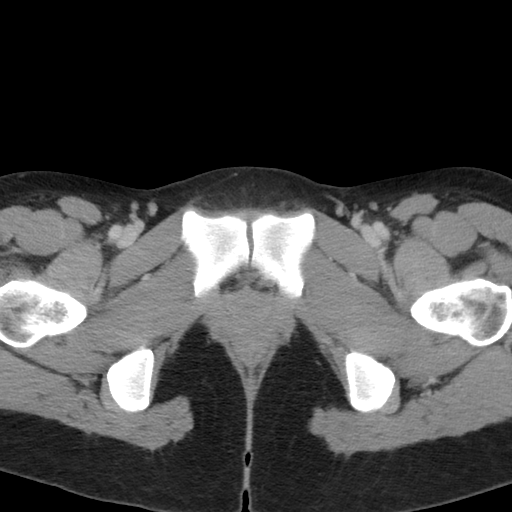
[im 6/88  bone]
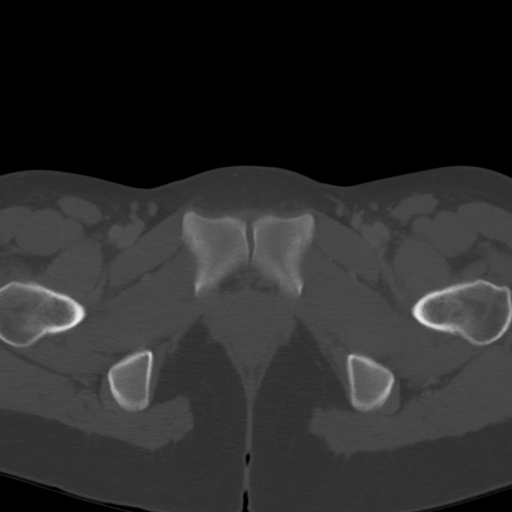
[im 11/88  soft-tissue]
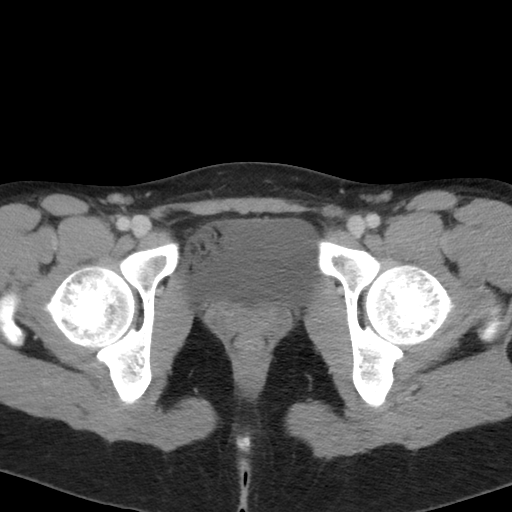
[im 17/88  soft-tissue]
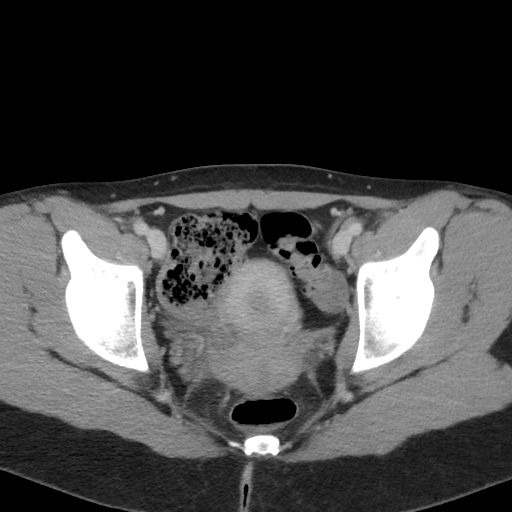
[im 28/88  soft-tissue]
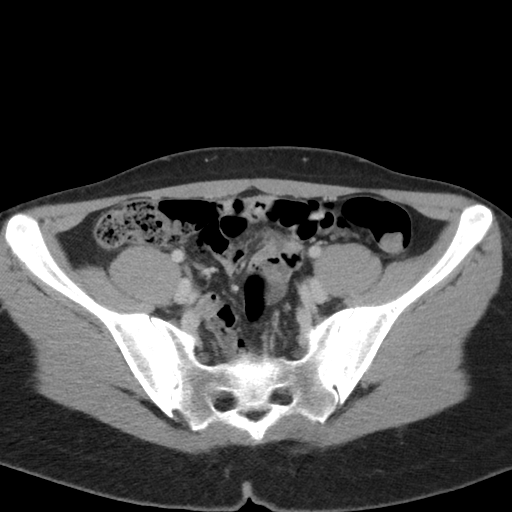
[im 33/88  soft-tissue]
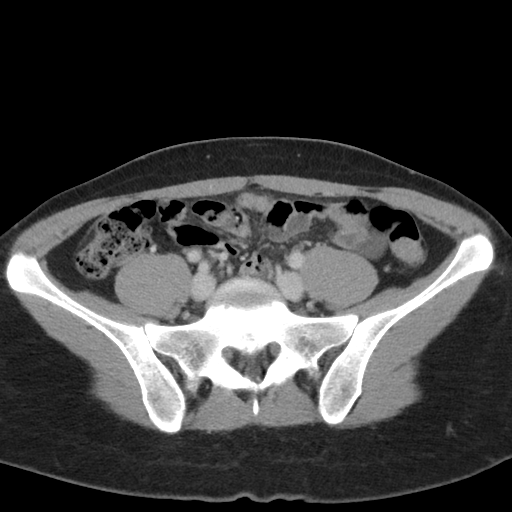
[im 39/88  soft-tissue]
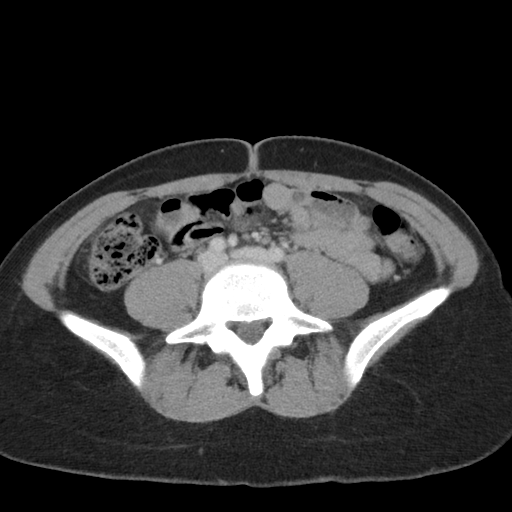
[im 44/88  soft-tissue]
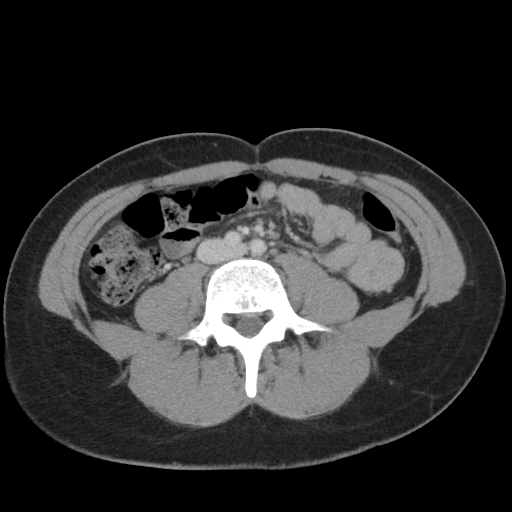
[im 49/88  soft-tissue]
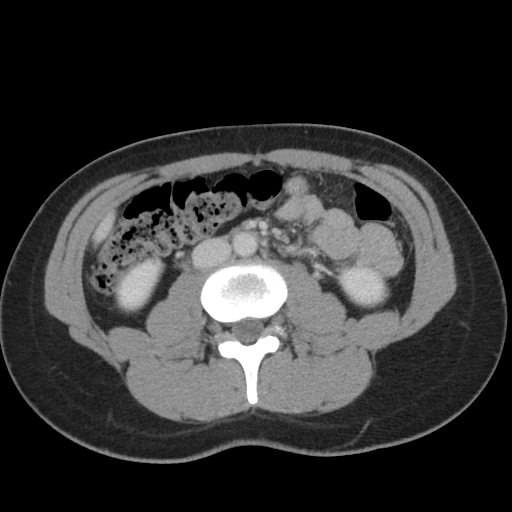
[im 55/88  soft-tissue]
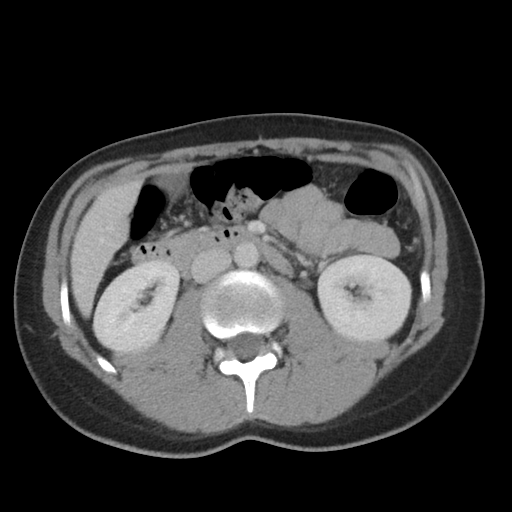
[im 55/88  bone]
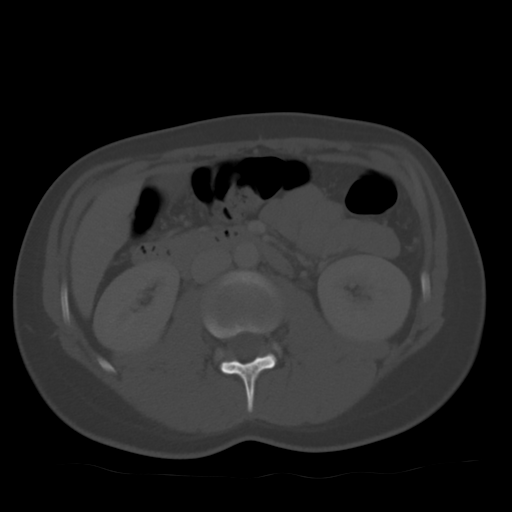
[im 60/88  soft-tissue]
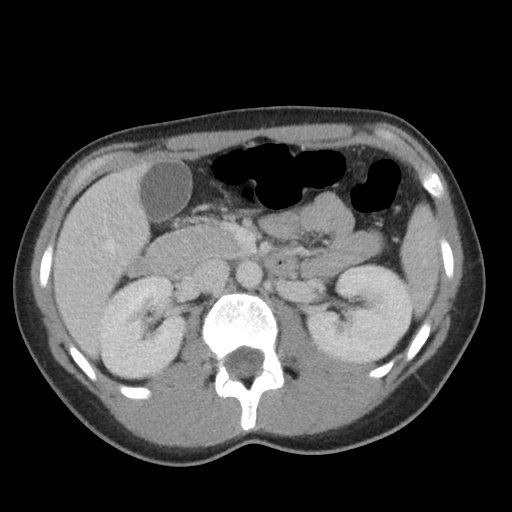
[im 71/88  soft-tissue]
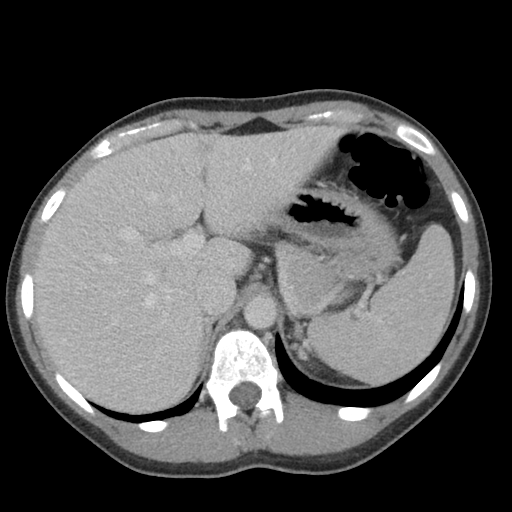
[im 77/88  soft-tissue]
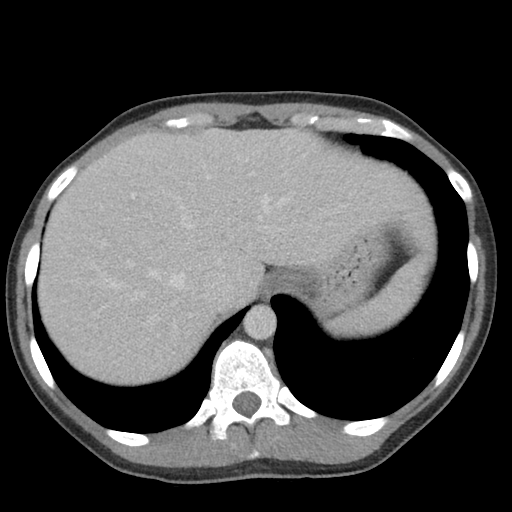
[im 82/88  soft-tissue]
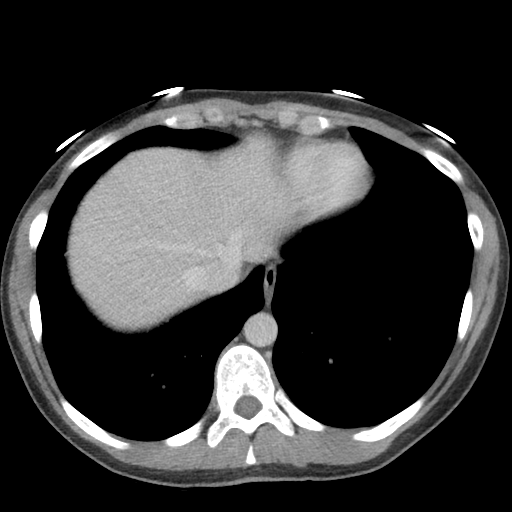

[Series 5: coronal st · coronal · 0.61mm/px · 3 of 64 slices shown]
[im 22/64  soft-tissue]
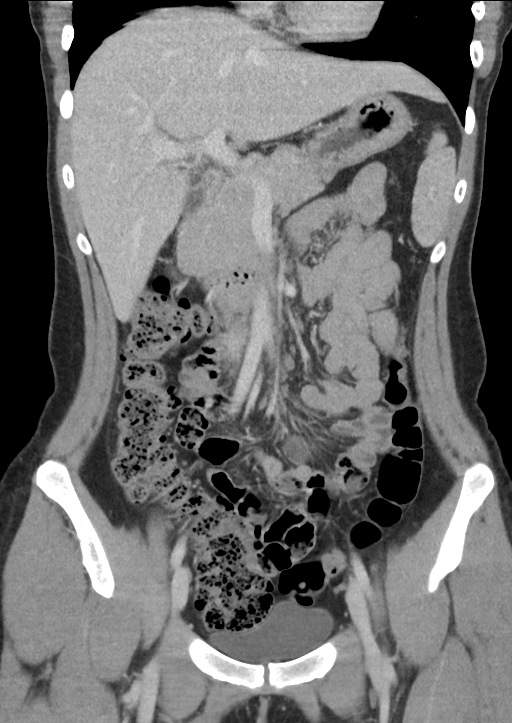
[im 29/64  soft-tissue]
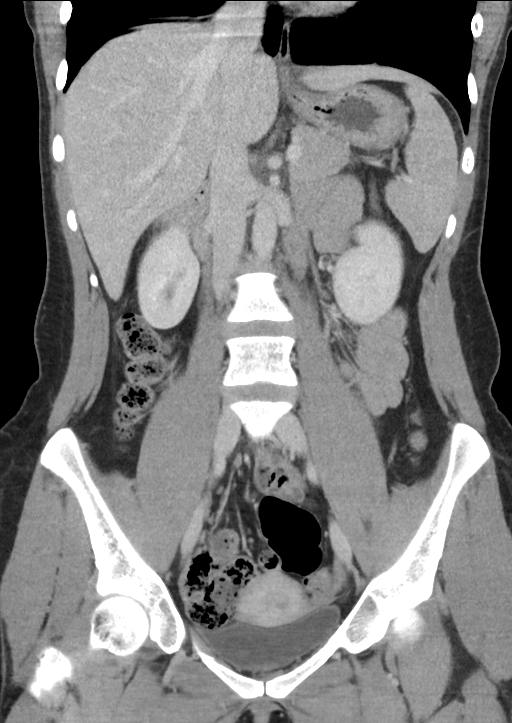
[im 36/64  soft-tissue]
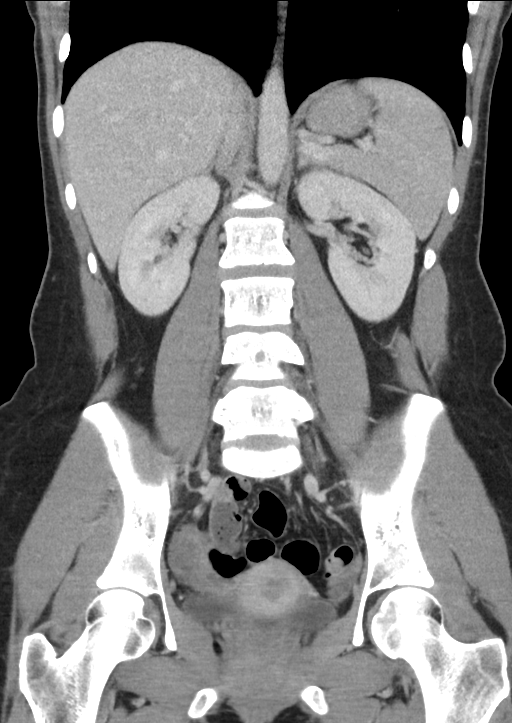

[16 of 46 positions shown; findings below may reference images not displayed]

FINDINGS: Lower chest:  No contributory findings.

Hepatobiliary: No focal liver abnormality.No evidence of biliary
obstruction or stone.

Pancreas: Unremarkable.

Spleen: Unremarkable.

Adrenals/Urinary Tract: Negative adrenals. No hydronephrosis or
stone. Unremarkable bladder.

Stomach/Bowel: No obstruction. No evidence of bowel inflammation
including appendicitis.

Vascular/Lymphatic: No acute vascular abnormality. No mass or
adenopathy.

Reproductive:No pathologic findings.

Other: Trace pelvic fluid which is low-density.

Musculoskeletal: No acute abnormalities.
IMPRESSION: 1. No acute finding.  No evidence of colitis.
2. Trace pelvic fluid which is likely physiologic.

## 2021-02-06 ENCOUNTER — Other Ambulatory Visit: Payer: Self-pay

## 2021-02-06 ENCOUNTER — Emergency Department (HOSPITAL_COMMUNITY): Payer: 59

## 2021-02-06 ENCOUNTER — Emergency Department (HOSPITAL_COMMUNITY)
Admission: EM | Admit: 2021-02-06 | Discharge: 2021-02-06 | Disposition: A | Discharge status: Home / Self Care | Payer: 59 | Attending: Emergency Medicine | Admitting: Emergency Medicine

## 2021-02-06 ENCOUNTER — Encounter (HOSPITAL_COMMUNITY): Payer: Self-pay

## 2021-02-06 DIAGNOSIS — R1032 Left lower quadrant pain: Secondary | ICD-10-CM | POA: Insufficient documentation

## 2021-02-06 DIAGNOSIS — R102 Pelvic and perineal pain: Secondary | ICD-10-CM

## 2021-02-06 DIAGNOSIS — N898 Other specified noninflammatory disorders of vagina: Secondary | ICD-10-CM | POA: Diagnosis not present

## 2021-02-06 LAB — URINALYSIS, ROUTINE W REFLEX MICROSCOPIC
Bilirubin Urine: NEGATIVE
Glucose, UA: NEGATIVE mg/dL
Ketones, ur: NEGATIVE mg/dL
Leukocytes,Ua: NEGATIVE
Nitrite: NEGATIVE
Protein, ur: NEGATIVE mg/dL
Specific Gravity, Urine: 1.005 (ref 1.005–1.030)
pH: 8 (ref 5.0–8.0)

## 2021-02-06 LAB — CBC
HCT: 50.7 % — ABNORMAL HIGH (ref 36.0–46.0)
Hemoglobin: 16.7 g/dL — ABNORMAL HIGH (ref 12.0–15.0)
MCH: 29.7 pg (ref 26.0–34.0)
MCHC: 32.9 g/dL (ref 30.0–36.0)
MCV: 90.2 fL (ref 80.0–100.0)
Platelets: 206 10*3/uL (ref 150–400)
RBC: 5.62 MIL/uL — ABNORMAL HIGH (ref 3.87–5.11)
RDW: 13 % (ref 11.5–15.5)
WBC: 5.3 10*3/uL (ref 4.0–10.5)
nRBC: 0 % (ref 0.0–0.2)

## 2021-02-06 LAB — WET PREP, GENITAL
Clue Cells Wet Prep HPF POC: NONE SEEN
Sperm: NONE SEEN
Trich, Wet Prep: NONE SEEN
Yeast Wet Prep HPF POC: NONE SEEN

## 2021-02-06 LAB — COMPREHENSIVE METABOLIC PANEL
ALT: 18 U/L (ref 0–44)
AST: 22 U/L (ref 15–41)
Albumin: 4.8 g/dL (ref 3.5–5.0)
Alkaline Phosphatase: 66 U/L (ref 38–126)
Anion gap: 8 (ref 5–15)
BUN: 7 mg/dL (ref 6–20)
CO2: 30 mmol/L (ref 22–32)
Calcium: 9.9 mg/dL (ref 8.9–10.3)
Chloride: 104 mmol/L (ref 98–111)
Creatinine, Ser: 0.6 mg/dL (ref 0.44–1.00)
GFR, Estimated: >60 mL/min (ref >60)
Glucose, Bld: 102 mg/dL — ABNORMAL HIGH (ref 70–99)
Potassium: 4.1 mmol/L (ref 3.5–5.1)
Sodium: 142 mmol/L (ref 135–145)
Total Bilirubin: 0.7 mg/dL (ref 0.3–1.2)
Total Protein: 8.6 g/dL — ABNORMAL HIGH (ref 6.5–8.1)

## 2021-02-06 LAB — I-STAT BETA HCG BLOOD, ED (MC, WL, AP ONLY): I-stat hCG, quantitative: <5 m[IU]/mL (ref <5)

## 2021-02-06 LAB — LIPASE, BLOOD: Lipase: 30 U/L (ref 11–51)

## 2021-02-06 MED ORDER — SODIUM CHLORIDE 0.9 % IV BOLUS: 500.0000 mL | Freq: Once | INTRAVENOUS | Status: DC

## 2021-02-06 NOTE — Discharge Instructions (Addendum)
Today your blood work, urine testing and ultrasounds were normal and reassuring.  As we discussed I would recommend following a bland diet for the next few days.  Additionally many people can be constipated, even if having a bowel movement daily.  Please consider taking half a cap ful of miralax daily and increasing your water consumption.  If you develop fevers, severe pain, have obvious blood in large amounts in your urine, or have other concerns please return to the ER.    Please do not have sexual contact until this has resolved.

## 2021-02-06 NOTE — ED Provider Notes (Signed)
[facility]-EMERGENCY DEPT Provider Note   CSN: [id number] Arrival date & time: 02/06/21  9150     History Chief Complaint  Patient presents with   Abdominal Pain    Mallory Singleton is a 25 y.o. female with a past medical history of vulvodynia, who presents today for evaluation of left lower quadrant abdominal pain.  She states that 4 days ago she began having pain in her in her left lower abdomen.  She was seen at urgent care on 02/04/2021 where there was suspicion for UTI and she was started on Macrobid.  She denies any fevers, no nausea vomiting or diarrhea.  She states that she is concerned as her pain has not gotten better or changed.  She denies any abnormal vaginal discharge.  She is sexually active with 1 female partner.  She did have sexual intercourse the night before her symptoms started.  She reports bowel movements are regular.    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   HPI     Past Medical History:  Diagnosis Date   Migraines     There are no problems to display for this patient.   History reviewed. No pertinent surgical history.   OB History   No obstetric history on file.     History reviewed. No pertinent family history.  Social History   Tobacco Use   Smoking status: Never   Smokeless tobacco: Never  Vaping Use   Vaping Use: Never used  Substance Use Topics   Alcohol use: Yes   Drug use: Never    Home Medications Prior to Admission medications   Medication Sig Start Date End Date Taking? Authorizing Provider  nitrofurantoin, macrocrystal-monohydrate, (MACROBID) 100 MG capsule Take 100 mg by mouth daily. 02/04/21 02/11/21 Yes [provider]    Allergies    Patient has no known allergies.  Review of Systems   Review of Systems  Constitutional:  Negative for chills and fever.  Eyes:  Negative for visual disturbance.  Respiratory:   Negative for shortness of breath.   Cardiovascular:  Negative for chest pain.  Gastrointestinal:  Positive for abdominal pain. Negative for blood in stool, constipation, diarrhea, nausea and vomiting.  Genitourinary:  Positive for pelvic pain. Negative for decreased urine volume, difficulty urinating, dysuria, flank pain and vaginal discharge.  Musculoskeletal:  Negative for back pain.  Skin:  Negative for color change and rash.  Neurological:  Negative for weakness and headaches.  Psychiatric/Behavioral:  Negative for confusion.   All other systems reviewed and are negative.  Physical Exam Updated Vital Signs BP 110/80   Pulse 84   Temp (!) 97.5 F (36.4 C) (Oral)   Resp 16   Ht 5\' 6"  (1.676 m)   Wt 68 kg   SpO2 98%   BMI 24.21 kg/m   Physical Exam Vitals and nursing note reviewed. Exam conducted with a chaperone present (Female ED tech).  Constitutional:      General: She is not in acute distress.    Appearance: She is not ill-appearing.  HENT:     Head: Atraumatic.  Eyes:     Conjunctiva/sclera: Conjunctivae normal.  Cardiovascular:     Rate and Rhythm: Normal rate.  Pulmonary:     Effort: Pulmonary effort is normal. No respiratory distress.  Abdominal:     General: There is no distension.     Palpations: Abdomen is soft.  Tenderness: There is no abdominal tenderness.  Genitourinary:    Comments: Normal external female genitalia.  There is no cervical motion tenderness.  No adnexal tenderness or fullness. There is a moderate amount of clear discharge in the vaginal canal.  Cervical os is closed.   Musculoskeletal:     Cervical back: Normal range of motion and neck supple.     Comments: No obvious acute injury  Skin:    General: Skin is warm.  Neurological:     Mental Status: She is alert.     Comments: Awake and alert, answers all questions appropriately.  Speech is not slurred.  Psychiatric:        Mood and Affect: Mood normal.        Behavior: Behavior  normal.    ED Results / Procedures / Treatments   Labs (all labs ordered are listed, but only abnormal results are displayed) Labs Reviewed  WET PREP, GENITAL - Abnormal; Notable for the following components:      Result Value   WBC, Wet Prep HPF POC MODERATE (*)    All other components within normal limits  COMPREHENSIVE METABOLIC PANEL - Abnormal; Notable for the following components:   Glucose, Bld 102 (*)    Total Protein 8.6 (*)    All other components within normal limits  CBC - Abnormal; Notable for the following components:   RBC 5.62 (*)    Hemoglobin 16.7 (*)    HCT 50.7 (*)    All other components within normal limits  URINALYSIS, ROUTINE W REFLEX MICROSCOPIC - Abnormal; Notable for the following components:   APPearance HAZY (*)    Hgb urine dipstick SMALL (*)    Bacteria, UA RARE (*)    All other components within normal limits  LIPASE, BLOOD  I-STAT BETA HCG BLOOD, ED ([facility], [facility], [facility] ONLY)  GC/CHLAMYDIA PROBE AMP ([place]) NOT AT [facility]    EKG None  Radiology US Renal  Result Date: 02/06/2021 CLINICAL DATA:  Hematuria.  Pelvic pain. EXAM: RENAL / URINARY TRACT ULTRASOUND COMPLETE COMPARISON:  None. FINDINGS: Right Kidney: Renal measurements: 11.0 x 4.2 x 6.0 cm = volume: 144 mL. Echogenicity within normal limits. No mass or hydronephrosis visualized. Left Kidney: Renal measurements: 11.6 x 5.0 x 4.8 cm = volume: 147.4 mL. Echogenicity within normal limits. No mass or hydronephrosis visualized. Bladder: Poor distention limiting evaluation with no obvious abnormality. Other: None. IMPRESSION: No cause for the patient's symptoms identified. The kidneys are normal in appearance. The bladder is incompletely distended limiting evaluation but no abnormalities are seen. Electronically Signed   By: Gerome Sam III M.D.   On: 02/06/2021 14:12   US PELVIC COMPLETE W TRANSVAGINAL AND TORSION R/O  Result Date: 02/06/2021 CLINICAL DATA:  Four days of pelvic pain. EXAM:  TRANSABDOMINAL AND TRANSVAGINAL ULTRASOUND OF PELVIS DOPPLER ULTRASOUND OF OVARIES TECHNIQUE: Both transabdominal and transvaginal ultrasound examinations of the pelvis were performed. Transabdominal technique was performed for global imaging of the pelvis including uterus, ovaries, adnexal regions, and pelvic cul-de-sac. It was necessary to proceed with endovaginal exam following the transabdominal exam to visualize the endometrium and ovaries. Color and duplex Doppler ultrasound was utilized to evaluate blood flow to the ovaries. COMPARISON:  None. FINDINGS: Uterus Measurements: 9.2 x 2.8 x 4.0 cm = volume: 53.2 mL. No fibroids or other mass visualized. Endometrium Thickness: 9 mm.  Normal. Right ovary Measurements: 4.0 x 1.4 x 2.6 cm = volume: 7.4 mL. Normal appearance/no adnexal mass. Left ovary Measurements: 3.6  x 2.1 x 2.8 cm = volume: 10.7 mL. Normal appearance/no adnexal mass. Pulsed Doppler evaluation of both ovaries demonstrates normal low-resistance arterial and venous waveforms. Other findings No abnormal free fluid. IMPRESSION: No cause for the patient's symptoms identified. The uterus, endometrium, and ovaries are unremarkable. Arterial and venous blood flow seen in both ovaries with no evidence of torsion. Electronically Signed   By: Gerome Sam III M.D.   On: 02/06/2021 14:11    Procedures Procedures   Medications Ordered in ED Medications - No data to display   ED Course  I have reviewed the triage vital signs and the nursing notes.  Pertinent labs & imaging results that were available during my care of the patient were reviewed by me and considered in my medical decision making (see chart for details).    MDM Rules/Calculators/A&P                         Patient is a 25 year old woman who presents today for evaluation of 4 days of left lower quadrant abdominal pain.  On exam her abdomen is soft, nontender, nondistended.  She has not had any nausea vomiting diarrhea, has no  history of diverticulitis.  Clinically she does not appear to have gastroenteritis or another cause for her symptoms.  She is being treated with Macrobid for possible UTI, here her urine has 0-5 reds, 0-5 whites, 0-5 squamous epithelial cells and rare bacteria.  Given that she is not currently having any urinary symptoms and still has 1-2 more days of Macrobid I do not think that she needs a culture or change in treatment/therapy.  She does not have any flank pain on exam.  She does not have leukocytosis, she is not anemic.  CMP is unremarkable.  Pregnancy test is negative.  Lipase is not elevated.  Given that her abdomen is soft nontender nondistended she does not appear to have an acute abdomen, especially in the setting of reassuring labs and blood work.  She does have some blood in her urine despite not having any UTI-like symptoms and the pain is left-sided.  Consideration for GYN cause of pain versus possible kidney stone.  Renal ultrasound is obtained without evidence of hydronephrosis.  Doubt significant kidney stone. Pelvic ultrasound is obtained without evidence of torsion, hemorrhagic cyst, adnexal mass, fibroids, or other abnormality to explain the cause of her pain.  Recommended the patient try taking half a dose of MiraLAX every day, in addition to eating a bland diet, continuing her prescribed Macrobid, and following up with her PCP.  Return precautions were discussed with patient who states their understanding.  At the time of discharge patient denied any unaddressed complaints or concerns.  Patient is agreeable for discharge home.  Note: Portions of this report may have been transcribed using voice recognition software. Every effort was made to ensure accuracy; however, inadvertent computerized transcription errors may be present Final Clinical Impression(s) / ED Diagnoses Final diagnoses:  Left lower quadrant abdominal pain    Rx / DC Orders ED Discharge Orders     None         Cristina Gong, PA-C 02/06/21 2056    Virgina Norfolk, DO 02/06/21 2305

## 2021-02-06 NOTE — ED Triage Notes (Signed)
Pt endorses LLQ pain since Wednesday. Pt denies N/V/D, blood in stool, and abnormal vaginal discharge/bleeding.

## 2021-02-08 LAB — GC/CHLAMYDIA PROBE AMP (~~LOC~~) NOT AT ARMC
Chlamydia: NEGATIVE
Comment: NEGATIVE
Comment: NORMAL
Neisseria Gonorrhea: NEGATIVE

## 2021-02-11 ENCOUNTER — Encounter: Payer: Self-pay | Admitting: Internal Medicine

## 2021-03-16 ENCOUNTER — Ambulatory Visit: Payer: 59 | Admitting: Internal Medicine

## 2021-04-19 ENCOUNTER — Encounter: Payer: Self-pay | Admitting: Internal Medicine

## 2021-04-19 ENCOUNTER — Other Ambulatory Visit (INDEPENDENT_AMBULATORY_CARE_PROVIDER_SITE_OTHER): Payer: 59

## 2021-04-19 ENCOUNTER — Ambulatory Visit (INDEPENDENT_AMBULATORY_CARE_PROVIDER_SITE_OTHER): Payer: 59 | Admitting: Internal Medicine

## 2021-04-19 DIAGNOSIS — K625 Hemorrhage of anus and rectum: Secondary | ICD-10-CM

## 2021-04-19 DIAGNOSIS — R109 Unspecified abdominal pain: Secondary | ICD-10-CM

## 2021-04-19 LAB — C-REACTIVE PROTEIN: CRP: <1 mg/dL (ref 0.5–20.0)

## 2021-04-19 NOTE — Progress Notes (Signed)
Chief Complaint: Abdominal pain  HPI : 25 year old female with history of migraines presents with abdominal pain.  About two years ago she had an episode of ab pain and was pooping blood (bright red) that was orange and smelled odd for which she went to the ED. Then her symptoms resolved and so she never went to GI for further evaluation. A few months ago, she felt this pain again. The pain is located in the LLQ. She thinks that something that she eats may cause it. The pain usually lasts for a few days and then goes away on its own. She is not currently feeling the pain right now. She has never had a colonoscopy. Her aunt has Crohn's disease. Denies fam hx of colon cancer. On average she has one BM per day. Denies blood thinners. She takes NSAIDs PRN. Denies dysphagia or N&V. Denies weight loss.  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.  Wt Readings from Last 3 Encounters:  04/19/21 145 lb 2 oz (65.8 kg)  02/06/21 150 lb (68 kg)  02/04/19 143 lb (64.9 kg)    Past Medical History:  Diagnosis Date   Migraines      Past Surgical History:  Procedure Laterality Date   VAGINA RECONSTRUCTION SURGERY  01/2020   Family History  Problem Relation Age of Onset   Breast cancer Mother        2013   Colon polyps Mother    Prostate cancer Paternal Grandfather    Stomach cancer Neg Hx    Rectal cancer Neg Hx    Esophageal cancer Neg Hx    Social History   Tobacco Use   Smoking status: Never   Smokeless tobacco: Never  Vaping Use   Vaping Use: Never used  Substance Use Topics   Alcohol use: Yes    Comment: socially   Drug use: Never   Current Outpatient Medications  Medication Sig Dispense Refill   cyclobenzaprine (FLEXERIL) 5 MG tablet Take 5 mg by mouth at bedtime as needed.     No current facility-administered medications for this visit.   No Known Allergies   Review of Systems: All systems reviewed and negative except where noted in HPI.   Physical Exam: BP  108/70   Pulse 82   Ht 5\' 6"  (1.676 m)   Wt 145 lb 2 oz (65.8 kg)   LMP 04/13/2021   BMI 23.42 kg/m  Constitutional: Pleasant,well-developed, female in no acute distress. HEENT: Normocephalic and atraumatic. Conjunctivae are normal. No scleral icterus. Cardiovascular: Normal rate, regular rhythm.  Pulmonary/chest: Effort normal and breath sounds normal. No wheezing, rales or rhonchi. Abdominal: Soft, nondistended, nontender. Bowel sounds active throughout. There are no masses palpable. No hepatomegaly. Extremities: No edema Neurological: Alert and oriented to person place and time. Skin: Skin is warm and dry. No rashes noted. Psychiatric: Normal mood and affect. Behavior is normal.  Labs 01/2021: CBC with elevated Hb of 16.7, CMP unremarkable, GC/CL was negative, wet prep with mod WBCs, neg preg test. Lipase normal.   Pelvic and Renal U/S 02/06/21: IMPRESSION: No cause for the patient's symptoms identified. The uterus, endometrium, and ovaries are unremarkable. Arterial and venous blood flow seen in both ovaries with no evidence of torsion.  CT A/P w/contrast 02/05/19: IMPRESSION: 1. No acute finding.  No evidence of colitis. 2. Trace pelvic fluid which is likely physiologic.  ASSESSMENT AND PLAN: Rectal bleeding Abdominal pain Patient presents with intermittent rectal bleeding and ab pain. Lab work up  and imaging in the past has not revealed a source of her symptoms. Would want to rule out IBD or malignancy by performing a colonoscopy. - Check CRP - Check colonoscopy [facility] - Consider antispasmodic or low FODMAP diet in the future - RTC 6 months  Eulah Pont, MD

## 2021-04-19 NOTE — Patient Instructions (Addendum)
If you are age 27 or younger, your body mass index should be between 19-25. Your Body mass index is 23.42 kg/m. If this is out of the aformentioned range listed, please consider follow up with your Primary Care Provider.  ________________________________________________________  The [facility] providers would like to encourage you to use [facility] to communicate with providers for non-urgent requests or questions.  Due to long hold times on the telephone, sending your provider a message by [facility] may be a faster and more efficient way to get a response.  Please allow 48 business hours for a response.  Please remember that this is for non-urgent requests.  _______________________________________________________  Your provider has requested that you go to the basement level for lab work before leaving today. Press "B" on the elevator. The lab is located at the first door on the left as you exit the elevator.  Due to recent changes in healthcare laws, you may see the results of your imaging and laboratory studies on MyChart before your provider has had a chance to review them.  We understand that in some cases there may be results that are confusing or concerning to you. Not all laboratory results come back in the same time frame and the provider may be waiting for multiple results in order to interpret others.  Please give [place] 48 hours in order for your provider to thoroughly review all the results before contacting the office for clarification of your results.   It has been recommended to you by your physician that you have a colonoscopy completed. Per your request, we did not schedule the procedure today. Please contact our office at [phone] should you decide to have the procedure completed. You will be scheduled for a pre-visit and procedure at that time.  You will need a follow up appointment in 6 months (May 2023).  We will contact you to schedule this appointment.   Thank you for entrusting me  with your care and choosing [facility].  Dr Christella Hartigan

## 2021-07-22 ENCOUNTER — Telehealth: Payer: Self-pay | Admitting: Internal Medicine

## 2021-07-22 NOTE — Telephone Encounter (Signed)
Patient is scheduled for 09/06/21 please provide prep instructions.

## 2021-07-27 NOTE — Telephone Encounter (Signed)
Left message for patient to return call for colonoscopy instructions. Will continue efforts.  °

## 2021-07-28 NOTE — Telephone Encounter (Signed)
Returned call to patient and colonoscopy instructions were discussed in detail with patient.  Colonoscopy instructions mailed to the patient for her review.  Advised patient to return call to our office with any further questions or concerns once she reviewed instructions.  Patient agreed to plan and verbalized understanding.  No further questions.

## 2021-07-28 NOTE — Telephone Encounter (Signed)
Patient returned your call, please advise. 

## 2021-07-28 NOTE — Telephone Encounter (Signed)
Left message for patient to return call for colonoscopy instructions. Will continue efforts.

## 2021-09-06 ENCOUNTER — Encounter: Payer: 59 | Admitting: Internal Medicine

## 2022-05-01 IMAGING — US US RENAL
1 series · 14 of 25 positions shown · non-contrast
Comparison: None.

CLINICAL DATA: Hematuria.  Pelvic pain.

EXAM:
RENAL / URINARY TRACT ULTRASOUND COMPLETE

[Series 1: us renal · 14 of 47 slices shown]
[im 1/47]
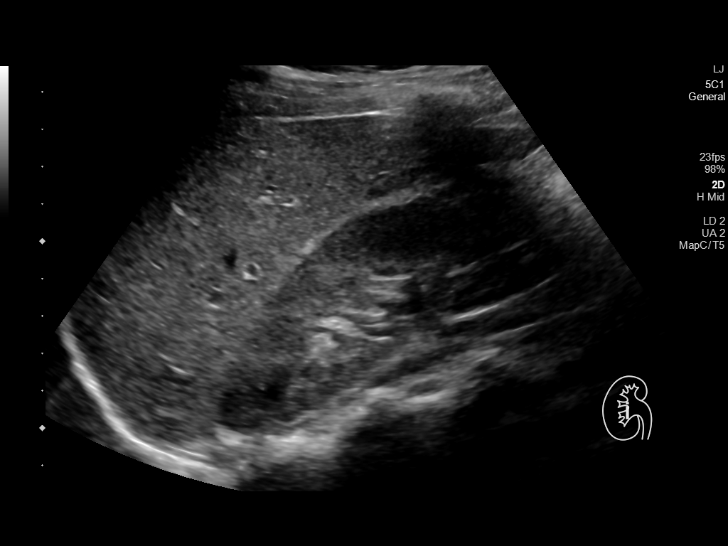
[im 4/47]
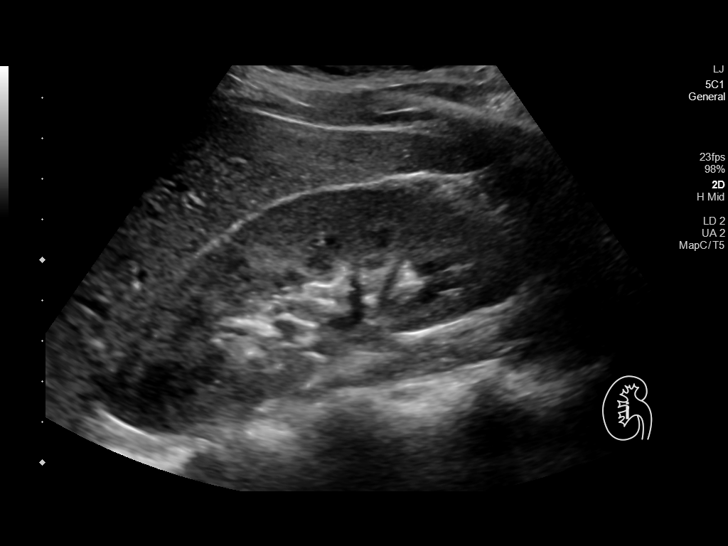
[im 8/47]
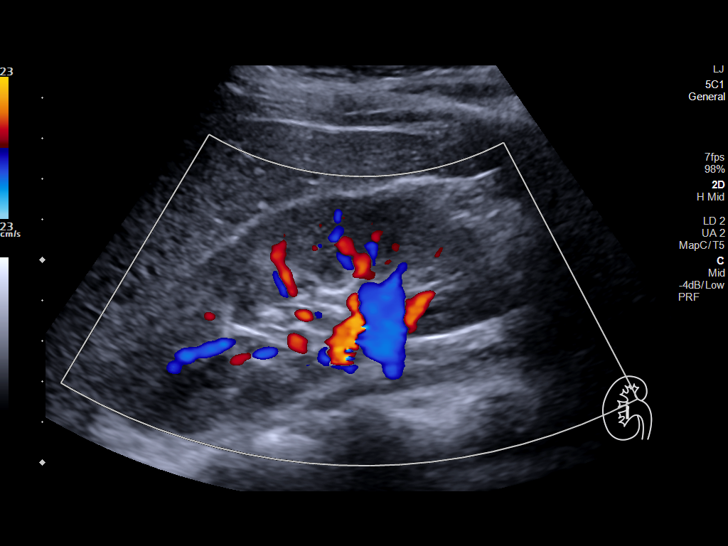
[im 12/47]
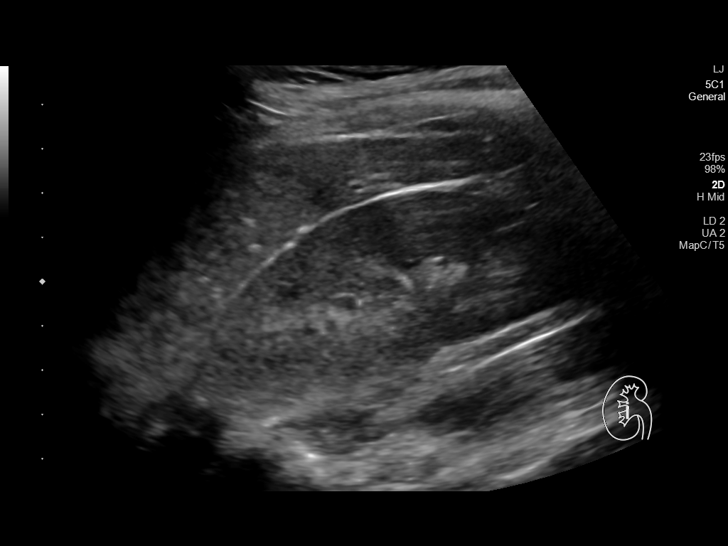
[im 16/47]
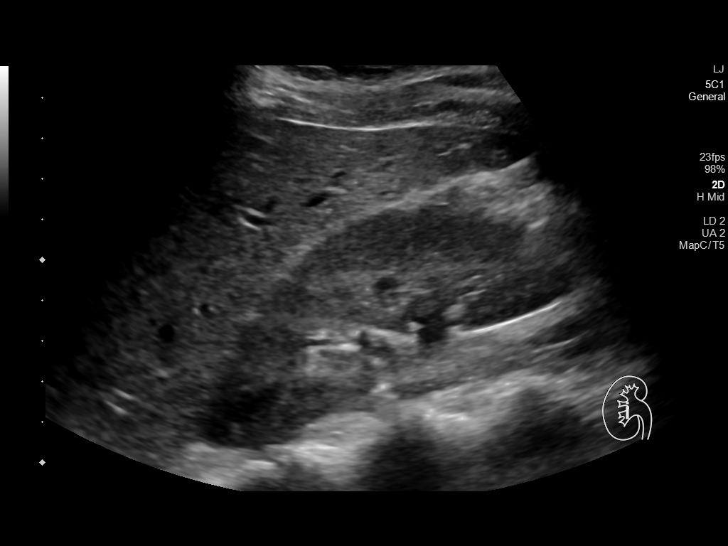
[im 18/47]
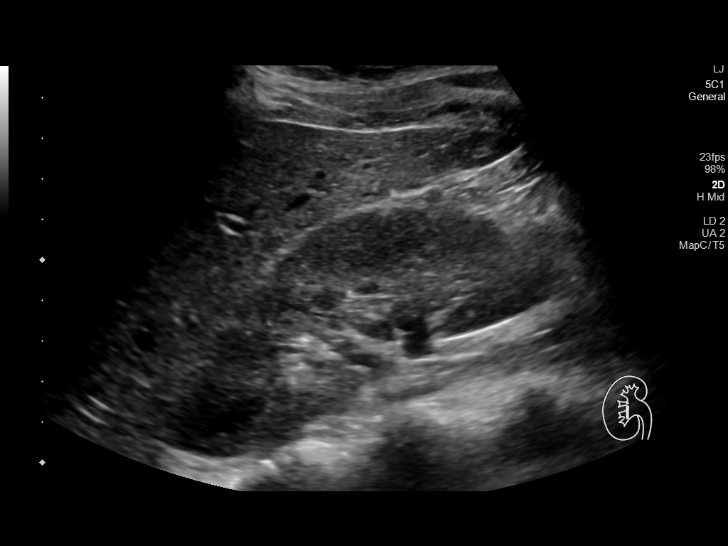
[im 22/47]
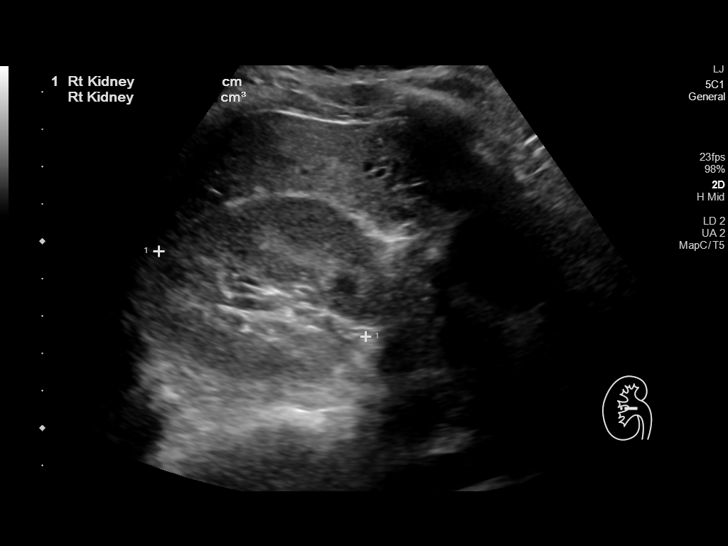
[im 25/47]
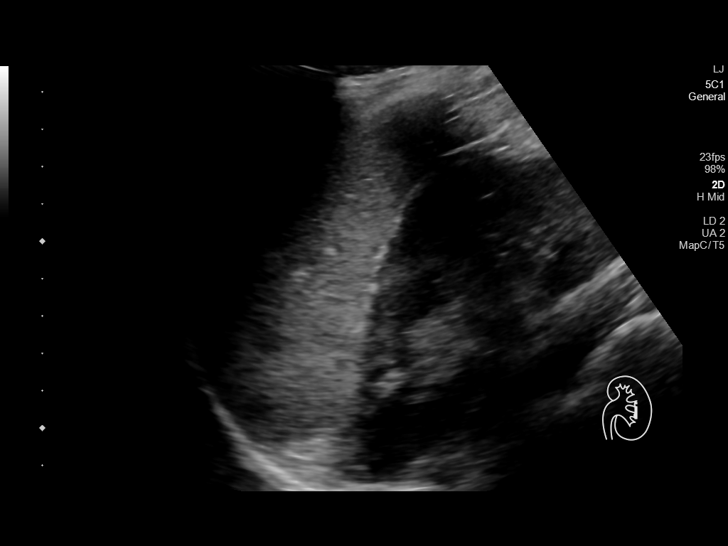
[im 29/47]
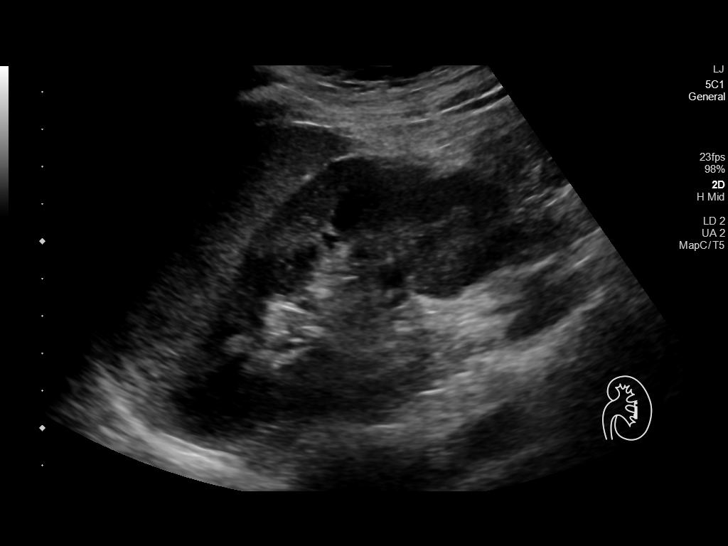
[im 31/47]
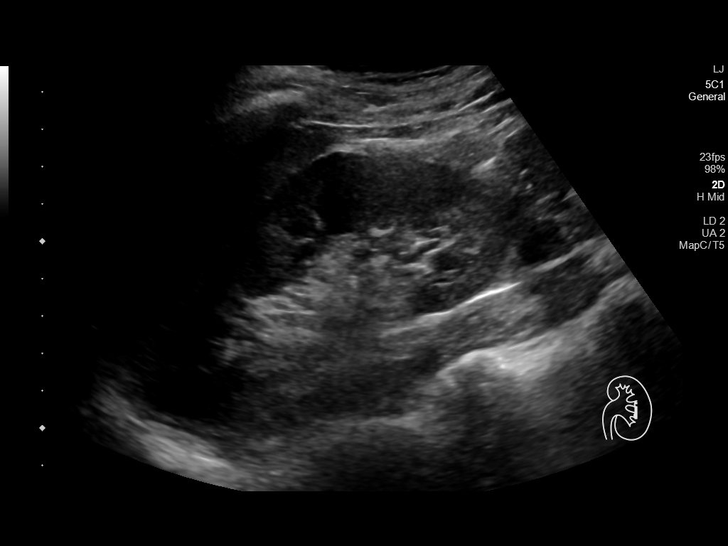
[im 35/47]
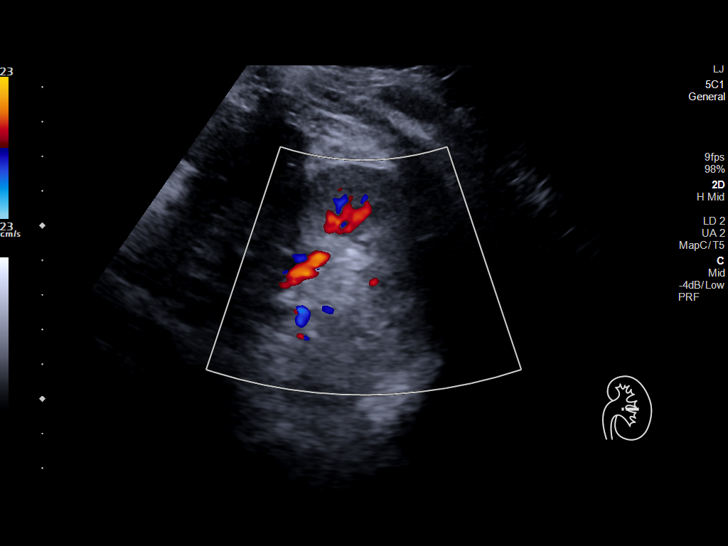
[im 39/47]
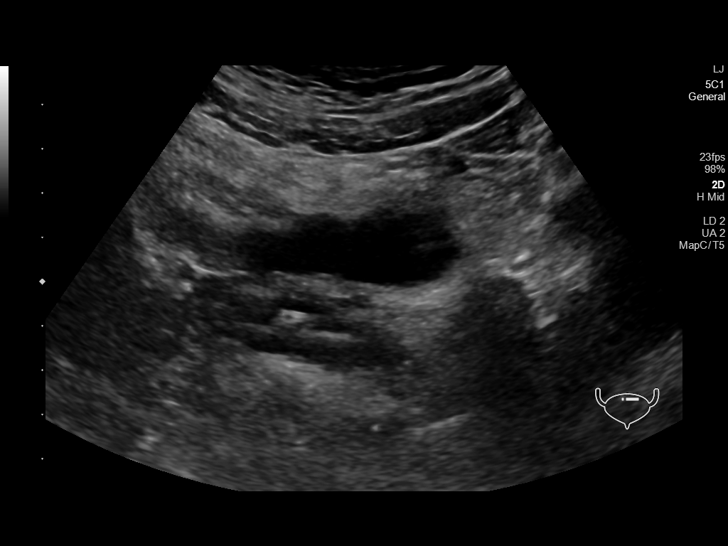
[im 43/47]
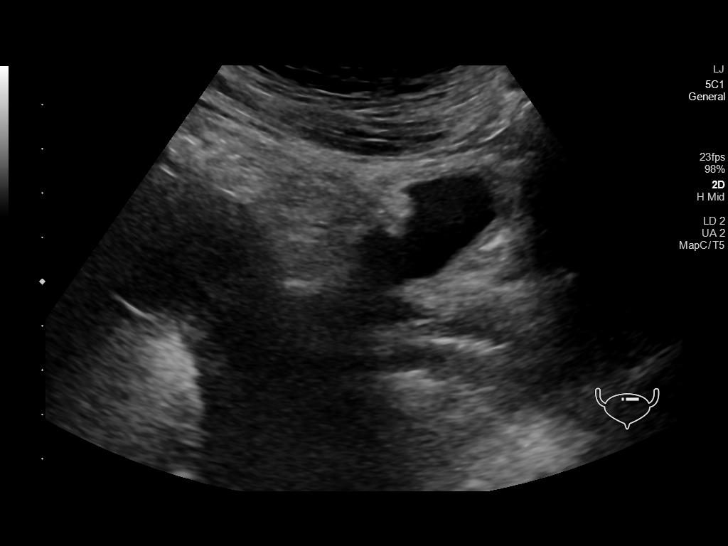
[im 47/47]
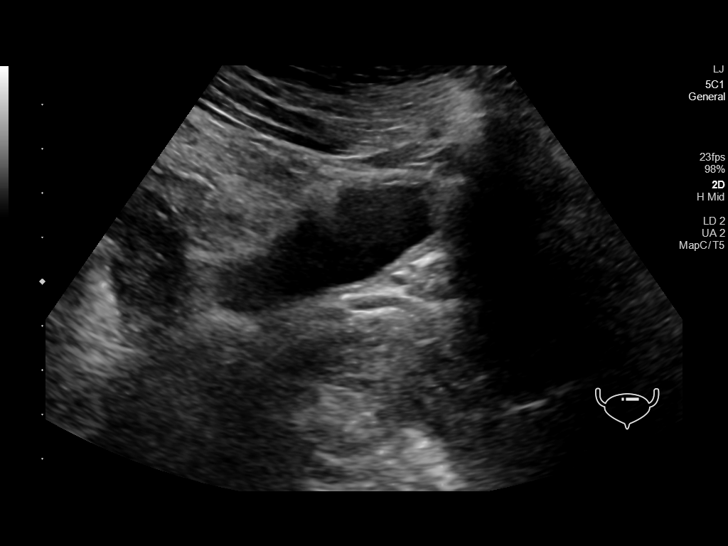

[14 of 25 positions shown; findings below may reference images not displayed]

FINDINGS: Right Kidney:

Renal measurements: 11.0 x 4.2 x 6.0 cm = volume: 144 mL.
Echogenicity within normal limits. No mass or hydronephrosis
visualized.

Left Kidney:

Renal measurements: 11.6 x 5.0 x 4.8 cm = volume: 147.4 mL.
Echogenicity within normal limits. No mass or hydronephrosis
visualized.

Bladder:

Poor distention limiting evaluation with no obvious abnormality.

Other:

None.
IMPRESSION: No cause for the patient's symptoms identified. The kidneys are
normal in appearance. The bladder is incompletely distended limiting
evaluation but no abnormalities are seen.

## 2022-05-01 IMAGING — US US PELVIS COMPLETE TRANSABD/TRANSVAG W DUPLEX
1 series · 13 of 25 positions shown · non-contrast
Comparison: None.

CLINICAL DATA: Four days of pelvic pain.

EXAM:
TRANSABDOMINAL AND TRANSVAGINAL ULTRASOUND OF PELVIS
DOPPLER ULTRASOUND OF OVARIES
TECHNIQUE: Both transabdominal and transvaginal ultrasound examinations of the
pelvis were performed. Transabdominal technique was performed for
global imaging of the pelvis including uterus, ovaries, adnexal
regions, and pelvic cul-de-sac.
It was necessary to proceed with endovaginal exam following the
transabdominal exam to visualize the endometrium and ovaries. Color
and duplex Doppler ultrasound was utilized to evaluate blood flow to
the ovaries.

[Series 1: us pelvis complete transabd/transvag w duplex · 13 of 118 slices shown]
[im 1/118]
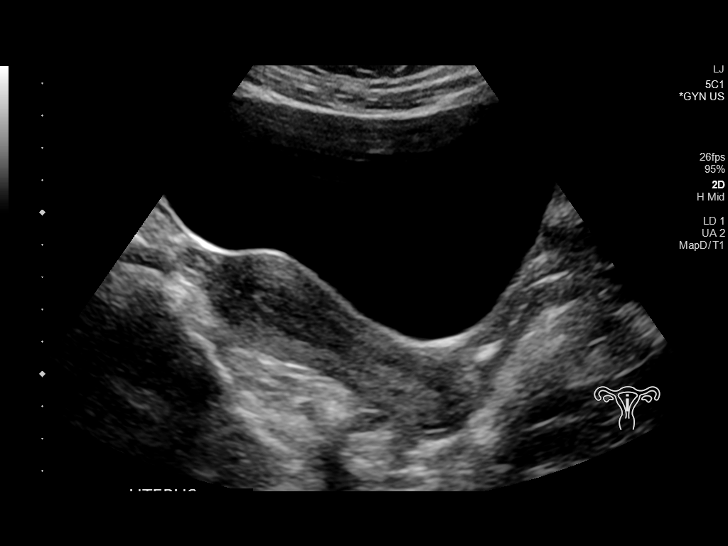
[im 10/118]
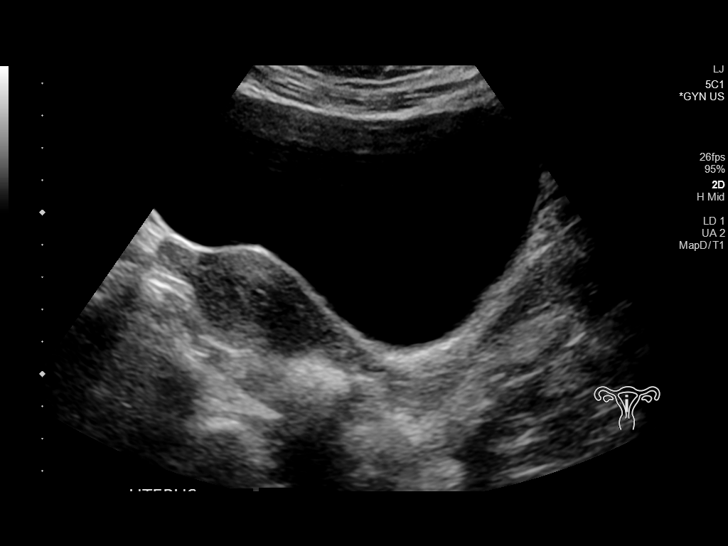
[im 20/118]
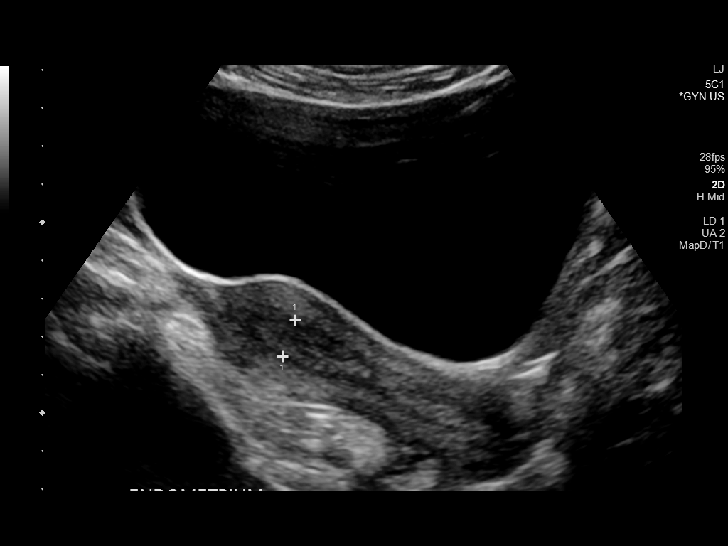
[im 30/118]
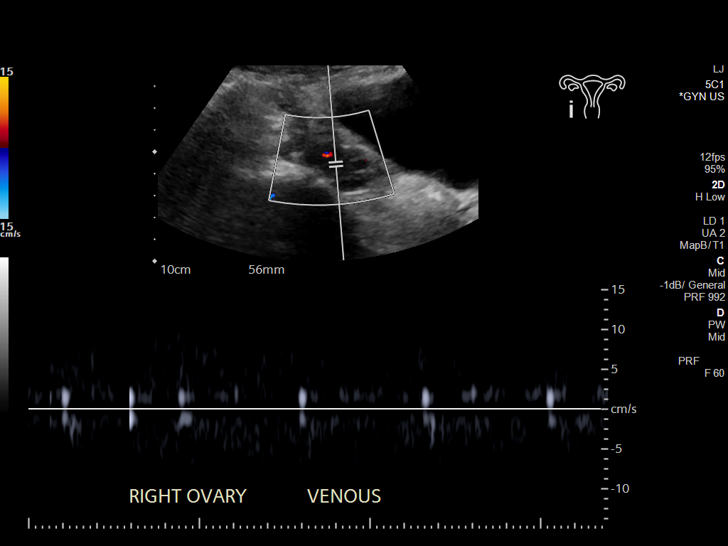
[im 40/118]
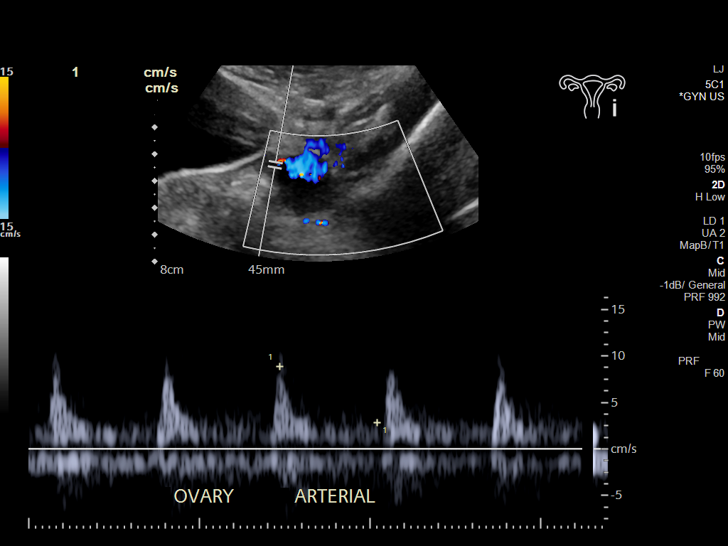
[im 49/118]
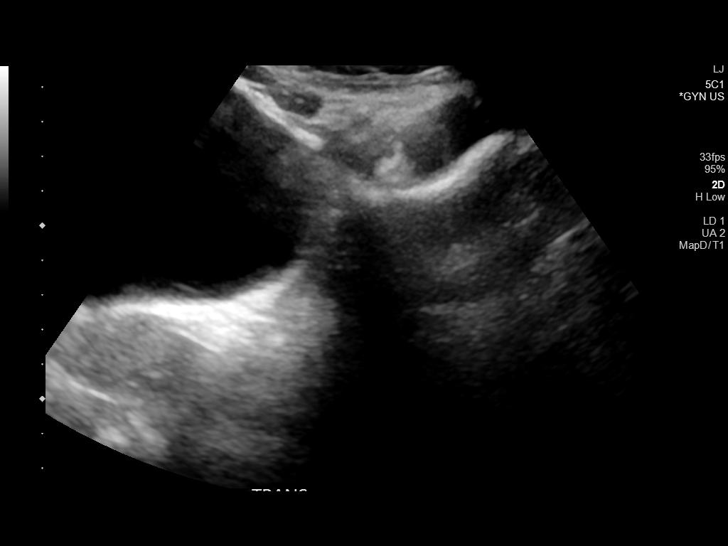
[im 59/118]
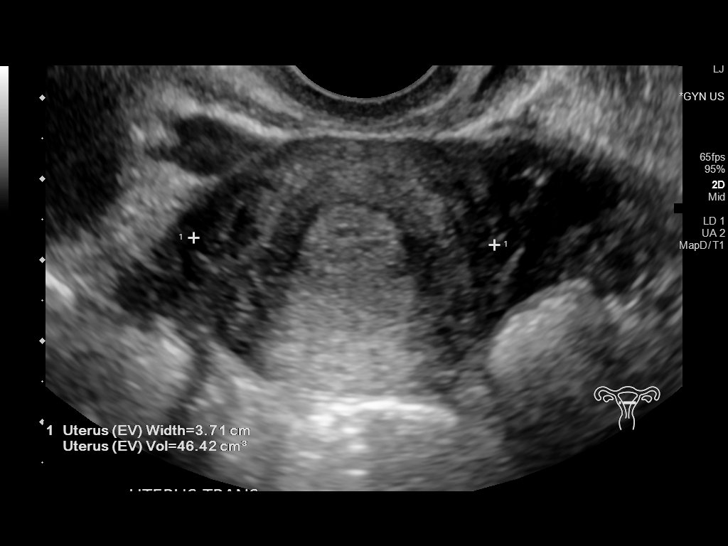
[im 69/118]
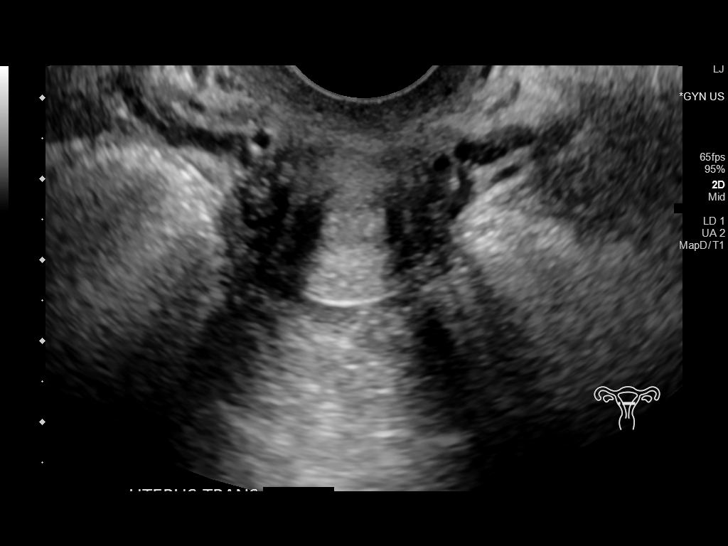
[im 79/118]
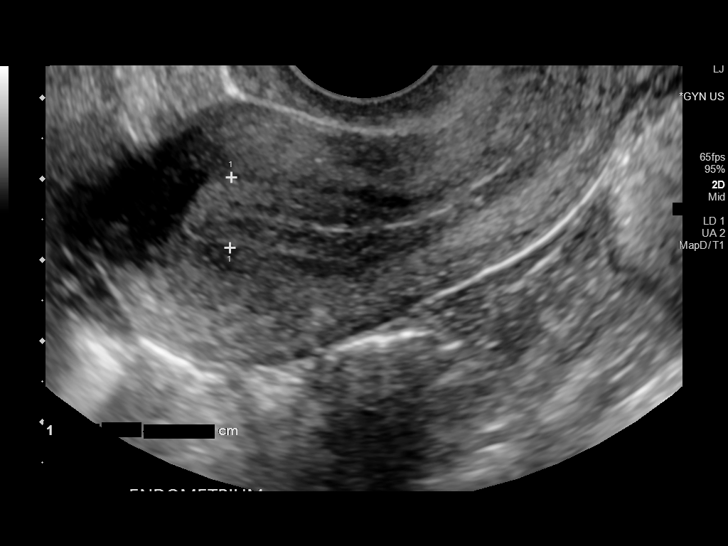
[im 88/118]
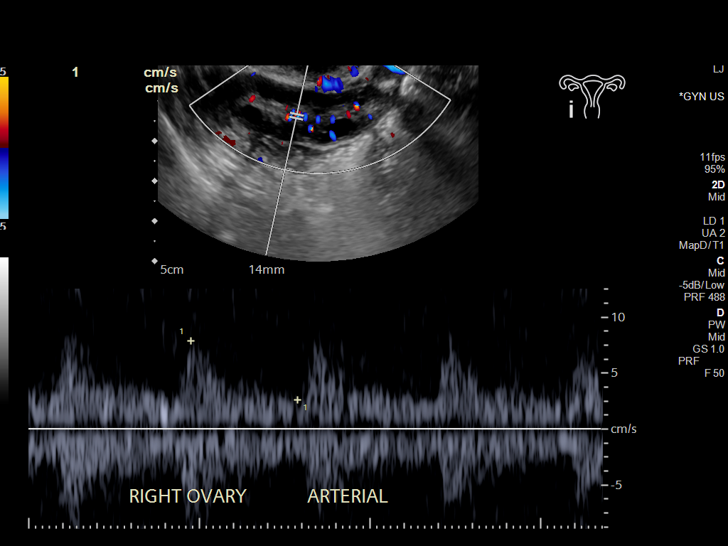
[im 98/118]
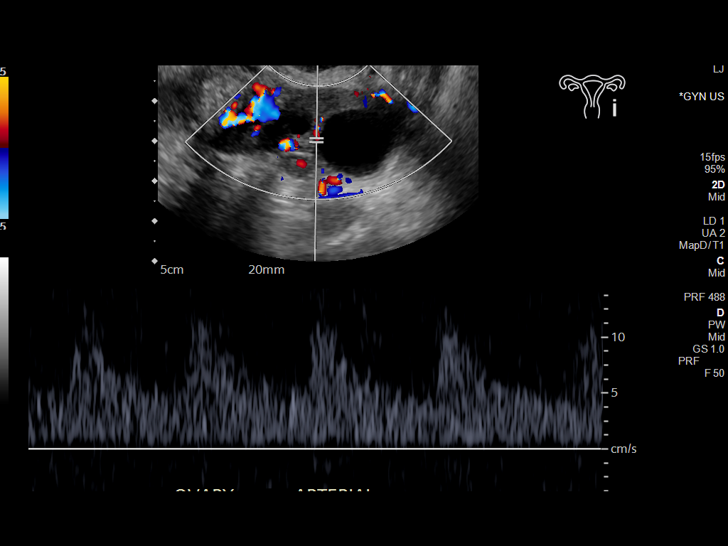
[im 108/118]
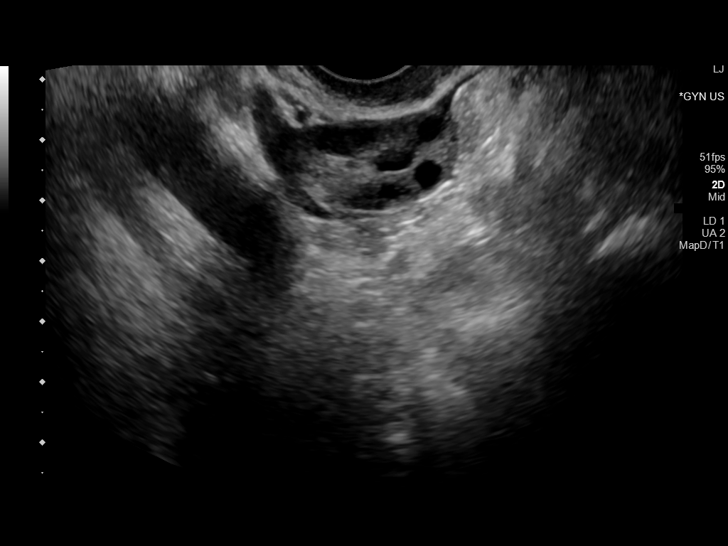
[im 118/118]
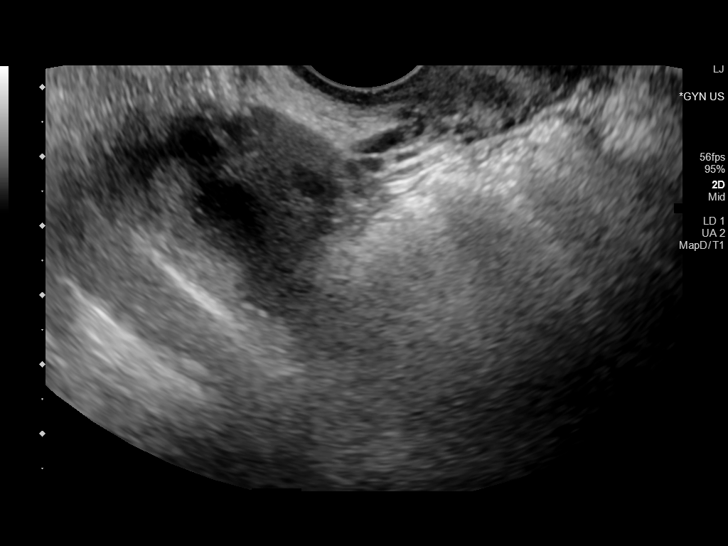

[13 of 25 positions shown; findings below may reference images not displayed]

FINDINGS: Uterus

Measurements: 9.2 x 2.8 x 4.0 cm = volume: 53.2 mL. No fibroids or
other mass visualized.

Endometrium

Thickness: 9 mm.  Normal.

Right ovary

Measurements: 4.0 x 1.4 x 2.6 cm = volume: 7.4 mL. Normal
appearance/no adnexal mass.

Left ovary

Measurements: 3.6 x 2.1 x 2.8 cm = volume: 10.7 mL. Normal
appearance/no adnexal mass.

Pulsed Doppler evaluation of both ovaries demonstrates normal
low-resistance arterial and venous waveforms.

Other findings

No abnormal free fluid.
IMPRESSION: No cause for the patient's symptoms identified. The uterus,
endometrium, and ovaries are unremarkable. Arterial and venous blood
flow seen in both ovaries with no evidence of torsion.
